# Patient Record
Sex: Female | Born: 1935 | Hispanic: No
Health system: Southern US, Community
[De-identification: ages and names within clinical notes are randomized; demographics above are authoritative.]

---

## 1998-05-09 ENCOUNTER — Other Ambulatory Visit: Admission: RE | Admit: 1998-05-09 | Discharge: 1998-05-09 | Payer: Self-pay | Admitting: Gynecology

## 1998-10-23 ENCOUNTER — Other Ambulatory Visit: Admission: RE | Admit: 1998-10-23 | Discharge: 1998-10-23 | Payer: Self-pay | Admitting: Gynecology

## 1999-11-24 ENCOUNTER — Other Ambulatory Visit: Admission: RE | Admit: 1999-11-24 | Discharge: 1999-11-24 | Payer: Self-pay | Admitting: Gynecology

## 2002-02-02 ENCOUNTER — Other Ambulatory Visit: Admission: RE | Admit: 2002-02-02 | Discharge: 2002-02-02 | Payer: Self-pay | Admitting: Gynecology

## 2015-11-13 ENCOUNTER — Other Ambulatory Visit: Payer: Self-pay | Admitting: Obstetrics & Gynecology

## 2015-11-13 DIAGNOSIS — R928 Other abnormal and inconclusive findings on diagnostic imaging of breast: Secondary | ICD-10-CM

## 2015-11-28 ENCOUNTER — Ambulatory Visit
Admission: RE | Admit: 2015-11-28 | Discharge: 2015-11-28 | Disposition: A | Discharge status: Home / Self Care | Payer: Medicare Other | Source: Ambulatory Visit | Attending: Obstetrics & Gynecology | Admitting: Obstetrics & Gynecology

## 2015-11-28 DIAGNOSIS — R928 Other abnormal and inconclusive findings on diagnostic imaging of breast: Secondary | ICD-10-CM

## 2016-04-28 ENCOUNTER — Other Ambulatory Visit: Payer: Self-pay | Admitting: Family Medicine

## 2016-04-28 DIAGNOSIS — R921 Mammographic calcification found on diagnostic imaging of breast: Secondary | ICD-10-CM

## 2016-05-19 DIAGNOSIS — Z7901 Long term (current) use of anticoagulants: Secondary | ICD-10-CM | POA: Insufficient documentation

## 2016-05-19 DIAGNOSIS — I4891 Unspecified atrial fibrillation: Secondary | ICD-10-CM | POA: Insufficient documentation

## 2016-05-26 ENCOUNTER — Ambulatory Visit
Admission: RE | Admit: 2016-05-26 | Discharge: 2016-05-26 | Disposition: A | Discharge status: Home / Self Care | Payer: Medicare Other | Source: Ambulatory Visit | Attending: Family Medicine | Admitting: Family Medicine

## 2016-05-26 DIAGNOSIS — R921 Mammographic calcification found on diagnostic imaging of breast: Secondary | ICD-10-CM

## 2016-10-28 ENCOUNTER — Other Ambulatory Visit: Payer: Self-pay | Admitting: Obstetrics & Gynecology

## 2016-10-28 DIAGNOSIS — R921 Mammographic calcification found on diagnostic imaging of breast: Secondary | ICD-10-CM

## 2016-11-04 ENCOUNTER — Ambulatory Visit (INDEPENDENT_AMBULATORY_CARE_PROVIDER_SITE_OTHER): Payer: Medicare Other | Admitting: Sports Medicine

## 2016-11-04 ENCOUNTER — Encounter: Payer: Self-pay | Admitting: Sports Medicine

## 2016-11-04 DIAGNOSIS — E119 Type 2 diabetes mellitus without complications: Secondary | ICD-10-CM | POA: Diagnosis not present

## 2016-11-04 DIAGNOSIS — L853 Xerosis cutis: Secondary | ICD-10-CM

## 2016-11-04 DIAGNOSIS — B351 Tinea unguium: Secondary | ICD-10-CM | POA: Diagnosis not present

## 2016-11-04 DIAGNOSIS — L84 Corns and callosities: Secondary | ICD-10-CM

## 2016-11-04 NOTE — Patient Instructions (Signed)

## 2016-11-04 NOTE — Progress Notes (Signed)
Subjective: Carol Parker is a 82 y.o. female patient with history of diabetes who presents to office today complaining of dry skin to heal and cracking and long, painful nails  while ambulating in shoes; unable to trim. Patient states that the glucose reading this morning was not recorded. Patient denies any new changes in medication or new problems. Patient denies any new cramping, numbness, burning or tingling in the legs.  Patient Active Problem List   Diagnosis Date Noted  . AF (atrial fibrillation) (HCC) 05/19/2016  . Chronic anticoagulation 05/19/2016   No current outpatient prescriptions on file prior to visit.   No current facility-administered medications on file prior to visit.    Allergies  Allergen Reactions  . Penicillins Rash    No results found for this or any previous visit (from the past 2160 hour(s)).  Objective: General: Patient is awake, alert, and oriented x 3 and in no acute distress.  Integument: Skin is warm, dry and supple bilateral. Nails are tender, long, thickened and dystrophic with subungual debris, consistent with onychomycosis, 1-5 bilateral. Severe dryness to plantar heels with mild fissuring and cracking No signs of infection. No open lesions or preulcerative lesions present bilateral. Remaining integument unremarkable.  Vasculature:  Dorsalis Pedis pulse 1/4 bilateral. Posterior Tibial pulse  1/4 bilateral. Capillary fill time 5 sec 1-5 bilateral. Scant hair growth to the level of the digits.Temperature gradient within normal limits. Mild varicosities present bilateral. No edema present bilateral.   Neurology: The patient has diminished sensation measured with a 5.07/10g Semmes Weinstein Monofilament at all pedal sites bilateral . Vibratory sensation diminished bilateral with tuning fork. No Babinski sign present bilateral.   Musculoskeletal: Asymptomatic hammertoe pedal deformities noted bilateral. Muscular strength 5/5 in all lower extremity muscular  groups bilateral without pain on range of motion. No tenderness with calf compression bilateral.  Assessment and Plan: Problem List Items Addressed This Visit    None    Visit Diagnoses    Dermatophytosis of nail    -  Primary   Callus of foot       Dry skin       Diabetes mellitus without complication (HCC)       Relevant Medications   losartan (COZAAR) 50 MG tablet   metFORMIN (GLUCOPHAGE-XR) 500 MG 24 hr tablet      -Examined patient. -Discussed and educated patient on diabetic foot care, especially with  regards to the vascular, neurological and musculoskeletal systems.  -Stressed the importance of good glycemic control and the detriment of not  controlling glucose levels in relation to the foot. -Mechanically debrided all nails 1-5 bilateral using sterile nail nipper and filed with dremel without incident  -Recommend provided term and daily skin emollients for dry heels and to apply Neosporin cream to fissuring to prevent infection -Advised patient to refrain from excessive use of Epsom salt 15 be contributing to dryness cannot get her heels -Answered all patient questions -Patient to return  in 3 months for at risk foot care -Patient advised to call the office if any problems or questions arise in the meantime.  Asencion Islamitorya Ladislaus Repsher, DPM

## 2016-11-05 ENCOUNTER — Ambulatory Visit
Admission: RE | Admit: 2016-11-05 | Discharge: 2016-11-05 | Disposition: A | Discharge status: Home / Self Care | Payer: Medicare Other | Source: Ambulatory Visit | Attending: Obstetrics & Gynecology | Admitting: Obstetrics & Gynecology

## 2016-11-05 DIAGNOSIS — R921 Mammographic calcification found on diagnostic imaging of breast: Secondary | ICD-10-CM

## 2016-11-30 ENCOUNTER — Telehealth: Payer: Self-pay | Admitting: *Deleted

## 2016-11-30 NOTE — Telephone Encounter (Addendum)
Pt called and states she has cracks in her heels and bottom of her feet. I spoke with pt and she is also having bleeding and drainage from the areas. I instructed pt to put antibiotic ointment to the areas, cover with white cotton socks and shoes until she was seen by Dr. Marylene LandStover.  Transferred to schedulers.

## 2016-11-30 NOTE — Telephone Encounter (Signed)
Ok thank you 

## 2016-12-02 ENCOUNTER — Ambulatory Visit (INDEPENDENT_AMBULATORY_CARE_PROVIDER_SITE_OTHER): Payer: Medicare Other | Admitting: Sports Medicine

## 2016-12-02 ENCOUNTER — Encounter: Payer: Self-pay | Admitting: Sports Medicine

## 2016-12-02 DIAGNOSIS — E119 Type 2 diabetes mellitus without complications: Secondary | ICD-10-CM

## 2016-12-02 DIAGNOSIS — L853 Xerosis cutis: Secondary | ICD-10-CM

## 2016-12-02 DIAGNOSIS — M79672 Pain in left foot: Secondary | ICD-10-CM

## 2016-12-02 DIAGNOSIS — M79671 Pain in right foot: Secondary | ICD-10-CM

## 2016-12-02 DIAGNOSIS — L84 Corns and callosities: Secondary | ICD-10-CM

## 2016-12-02 MED ORDER — LIDOCAINE 5 % EX OINT: 1.0000 "application " | TOPICAL_OINTMENT | Freq: Four times a day (QID) | CUTANEOUS | 4 refills | Status: DC | PRN

## 2016-12-02 NOTE — Progress Notes (Signed)
Subjective: Carol Parker is a 82 y.o. female patient with history of diabetes who returns to office today complaining of dry skin to heels and cracking states started to bleed and used antibiotic cream. Patient states that the glucose reading this morning was not recorded. Patient denies any other pedal complaints.  Patient Active Problem List   Diagnosis Date Noted  . AF (atrial fibrillation) (HCC) 05/19/2016  . Chronic anticoagulation 05/19/2016   Current Outpatient Prescriptions on File Prior to Visit  Medication Sig Dispense Refill  . albuterol (PROVENTIL HFA;VENTOLIN HFA) 108 (90 Base) MCG/ACT inhaler Inhale into the lungs.    Marland Kitchen. losartan (COZAAR) 50 MG tablet TK 1 T PO QD    . metFORMIN (GLUCOPHAGE-XR) 500 MG 24 hr tablet TK 1 T PO  QD WITH EVENING MEAL    . metoprolol succinate (TOPROL-XL) 50 MG 24 hr tablet TK 1 T PO QD    . Rivaroxaban (XARELTO) 15 MG TABS tablet Take 15 mg by mouth.     No current facility-administered medications on file prior to visit.    Allergies  Allergen Reactions  . Penicillins Rash    No results found for this or any previous visit (from the past 2160 hour(s)).  Objective: General: Patient is awake, alert, and oriented x 3 and in no acute distress.  Integument: Skin is warm, dry and supple bilateral. Nails are short consistent with onychomycosis, 1-5 bilateral. Severe dryness to plantar heels with mild fissuring and cracking; No signs of infection. No open lesions or preulcerative lesions present bilateral. Remaining integument unremarkable.  Vasculature:  Dorsalis Pedis pulse 1/4 bilateral. Posterior Tibial pulse  1/4 bilateral. Capillary fill time 5 sec 1-5 bilateral. Scant hair growth to the level of the digits.Temperature gradient within normal limits. Mild varicosities present bilateral. No edema present bilateral.   Neurology: The patient has diminished sensation measured with a 5.07/10g Semmes Weinstein Monofilament at all pedal sites  bilateral . Vibratory sensation diminished bilateral with tuning fork. No Babinski sign present bilateral.   Musculoskeletal: Asymptomatic hammertoe pedal deformities noted bilateral. Muscular strength 5/5 in all lower extremity muscular groups bilateral without pain on range of motion. No tenderness with calf compression bilateral.  Assessment and Plan: Problem List Items Addressed This Visit    None    Visit Diagnoses    Callus of foot    -  Primary   Dry skin       Diabetes mellitus without complication (HCC)       Foot pain, bilateral       Relevant Medications   lidocaine (XYLOCAINE) 5 % ointment     -Examined patient. -Discussed and educated patient on diabetic foot care, especially with  regards to the vascular, neurological and musculoskeletal systems.  -Stressed the importance of good glycemic control and the detriment of not  controlling glucose levels in relation to the foot. -Rx lidocaine ointment to use as needed for foot pain -Recommend continue with Revitaderm under occlusion at night and to apply Neosporin cream to fissuring to prevent infection during the day  -Advised patient to refrain from excessive use of Epsom salt at most once weekly -Answered all patient questions -Patient to return as scheduled in 2 months for routine diabetic foot care. -Patient advised to call the office if any problems or questions arise in the meantime.  Asencion Islamitorya Darren Caldron, DPM

## 2017-02-04 ENCOUNTER — Ambulatory Visit (INDEPENDENT_AMBULATORY_CARE_PROVIDER_SITE_OTHER): Payer: Medicare Other | Admitting: Sports Medicine

## 2017-02-04 ENCOUNTER — Telehealth: Payer: Self-pay | Admitting: *Deleted

## 2017-02-04 ENCOUNTER — Encounter: Payer: Self-pay | Admitting: Sports Medicine

## 2017-02-04 DIAGNOSIS — L84 Corns and callosities: Secondary | ICD-10-CM

## 2017-02-04 DIAGNOSIS — B351 Tinea unguium: Secondary | ICD-10-CM

## 2017-02-04 DIAGNOSIS — B359 Dermatophytosis, unspecified: Secondary | ICD-10-CM

## 2017-02-04 DIAGNOSIS — L853 Xerosis cutis: Secondary | ICD-10-CM | POA: Diagnosis not present

## 2017-02-04 DIAGNOSIS — E119 Type 2 diabetes mellitus without complications: Secondary | ICD-10-CM | POA: Diagnosis not present

## 2017-02-04 MED ORDER — TERBINAFINE HCL 1 % EX CREA: 1.0000 "application " | TOPICAL_CREAM | Freq: Two times a day (BID) | CUTANEOUS | 5 refills | Status: DC

## 2017-02-04 NOTE — Telephone Encounter (Addendum)
-----   Message from Asencion Islamitorya Stover, [place] sent at 02/04/2017 11:34 AM EST ----- Regarding: [facility] Tinea and thick callus skin plantar surfaces to feet not improved with urea cream and antifungals. Please see for other alternatives or possible biopsy   Patient sees Dr. Mayford KnifeWilliams for [facility] face at [facility]  Thanks Dr. Marylene LandStover. Required referral form, clinicals and demographics faxed to [facility].

## 2017-02-04 NOTE — Progress Notes (Signed)
Subjective: Carol Regino SchultzeM Bachtel is a 83 y.o. female patient with history of diabetes who returns to office today complaining of long nails and continued dry skin to heels and cracking, has been using revitaderm and using softening creams with no improvement. Patient states that the glucose reading this morning was not recorded. Patient denies any other pedal complaints.  Patient Active Problem List   Diagnosis Date Noted  . AF (atrial fibrillation) (HCC) 05/19/2016  . Chronic anticoagulation 05/19/2016   Current Outpatient Prescriptions on File Prior to Visit  Medication Sig Dispense Refill  . albuterol (PROVENTIL HFA;VENTOLIN HFA) 108 (90 Base) MCG/ACT inhaler Inhale into the lungs.    . lidocaine (XYLOCAINE) 5 % ointment Apply 1 application topically 4 (four) times daily as needed. 35 g 4  . losartan (COZAAR) 50 MG tablet TK 1 T PO QD    . metFORMIN (GLUCOPHAGE-XR) 500 MG 24 hr tablet TK 1 T PO  QD WITH EVENING MEAL    . metoprolol succinate (TOPROL-XL) 50 MG 24 hr tablet TK 1 T PO QD    . Rivaroxaban (XARELTO) 15 MG TABS tablet Take 15 mg by mouth.     No current facility-administered medications on file prior to visit.    Allergies  Allergen Reactions  . Penicillins Rash    No results found for this or any previous visit (from the past 2160 hour(s)).  Objective: General: Patient is awake, alert, and oriented x 3 and in no acute distress.  Integument: Skin is warm, dry and supple bilateral. Nails are elongated consistent with onychomycosis, 1-5 bilateral. Severe dryness to plantar heels with mild fissuring and cracking, possible superimposed tinea; No signs of infection. No open lesions or preulcerative lesions present bilateral. Remaining integument unremarkable.  Vasculature:  Dorsalis Pedis pulse 1/4 bilateral. Posterior Tibial pulse  1/4 bilateral. Capillary fill time 5 sec 1-5 bilateral. Scant hair growth to the level of the digits.Temperature gradient within normal limits. Mild  varicosities present bilateral. No edema present bilateral.   Neurology: The patient has diminished sensation measured with a 5.07/10g Semmes Weinstein Monofilament at all pedal sites bilateral . Vibratory sensation diminished bilateral with tuning fork. No Babinski sign present bilateral.   Musculoskeletal: Asymptomatic hammertoe pedal deformities noted bilateral. Muscular strength 5/5 in all lower extremity muscular groups bilateral without pain on range of motion. No tenderness with calf compression bilateral.  Assessment and Plan: Problem List Items Addressed This Visit    None    Visit Diagnoses    Dermatophytosis of nail    -  Primary   Relevant Medications   terbinafine (LAMISIL) 1 % cream   Callus of foot       Relevant Medications   terbinafine (LAMISIL) 1 % cream   Dry skin       Relevant Medications   terbinafine (LAMISIL) 1 % cream   Diabetes mellitus without complication (HCC)       Tinea       Relevant Medications   terbinafine (LAMISIL) 1 % cream     -Examined patient. -Discussed and educated patient on diabetic foot care, especially with  regards to the vascular, neurological and musculoskeletal systems.  -Stressed the importance of good glycemic control and the detriment of not  controlling glucose levels in relation to the foot. -Debrided nails x 10 using sterile nail nipper without incident -Rx Lamisil cream to use with other skin emollients -Consult to [facility] at [facility]  -Advised patient to refrain from excessive use of Epsom salt  at most once weekly -Answered all patient questions -Patient to return as scheduled in 2-3 months for routine diabetic foot care. -Patient advised to call the office if any problems or questions arise in the meantime.  Asencion Islam, DPM

## 2017-05-05 ENCOUNTER — Ambulatory Visit: Payer: Medicare Other | Admitting: Sports Medicine

## 2017-05-12 ENCOUNTER — Ambulatory Visit (INDEPENDENT_AMBULATORY_CARE_PROVIDER_SITE_OTHER): Payer: Medicare Other | Admitting: Sports Medicine

## 2017-05-12 ENCOUNTER — Encounter: Payer: Self-pay | Admitting: Sports Medicine

## 2017-05-12 DIAGNOSIS — M79672 Pain in left foot: Secondary | ICD-10-CM

## 2017-05-12 DIAGNOSIS — B351 Tinea unguium: Secondary | ICD-10-CM | POA: Diagnosis not present

## 2017-05-12 DIAGNOSIS — M79671 Pain in right foot: Secondary | ICD-10-CM | POA: Diagnosis not present

## 2017-05-12 DIAGNOSIS — L84 Corns and callosities: Secondary | ICD-10-CM

## 2017-05-12 DIAGNOSIS — E119 Type 2 diabetes mellitus without complications: Secondary | ICD-10-CM

## 2017-05-12 NOTE — Progress Notes (Signed)
Subjective: Carol Parker is a 83 y.o. female patient with history of diabetes who returns to office today complaining of long nails and continued dry skin to heels and cracking, has been to dermatologist with improvement. Patient states that the glucose reading this morning was not recorded. Patient denies any other pedal complaints.  Patient Active Problem List   Diagnosis Date Noted  . AF (atrial fibrillation) (HCC) 05/19/2016  . Chronic anticoagulation 05/19/2016   Current Outpatient Prescriptions on File Prior to Visit  Medication Sig Dispense Refill  . albuterol (PROVENTIL HFA;VENTOLIN HFA) 108 (90 Base) MCG/ACT inhaler Inhale into the lungs.    . lidocaine (XYLOCAINE) 5 % ointment Apply 1 application topically 4 (four) times daily as needed. 35 g 4  . losartan (COZAAR) 50 MG tablet TK 1 T PO QD    . metFORMIN (GLUCOPHAGE-XR) 500 MG 24 hr tablet TK 1 T PO  QD WITH EVENING MEAL    . metoprolol succinate (TOPROL-XL) 50 MG 24 hr tablet TK 1 T PO QD    . Rivaroxaban (XARELTO) 15 MG TABS tablet Take 15 mg by mouth.    . terbinafine (LAMISIL) 1 % cream Apply 1 application topically 2 (two) times daily. 30 g 5   No current facility-administered medications on file prior to visit.    Allergies  Allergen Reactions  . Penicillins Rash    No results found for this or any previous visit (from the past 2160 hour(s)).  Objective: General: Patient is awake, alert, and oriented x 3 and in no acute distress.  Integument: Skin is warm, dry and supple bilateral. Nails are elongated consistent with onychomycosis, 1-5 bilateral. Severe dryness to plantar heels with mild fissuring and cracking, possible superimposed tinea, improving; No signs of infection. No open lesions or preulcerative lesions present bilateral. Remaining integument unremarkable.  Vasculature:  Dorsalis Pedis pulse 1/4 bilateral. Posterior Tibial pulse  1/4 bilateral. Capillary fill time 5 sec 1-5 bilateral. Scant hair growth to  the level of the digits.Temperature gradient within normal limits. Mild varicosities present bilateral. No edema present bilateral.   Neurology: The patient has diminished sensation measured with a 5.07/10g Semmes Weinstein Monofilament at all pedal sites bilateral . Vibratory sensation diminished bilateral with tuning fork. No Babinski sign present bilateral.   Musculoskeletal: Asymptomatic hammertoe pedal deformities noted bilateral. Muscular strength 5/5 in all lower extremity muscular groups bilateral without pain on range of motion. No tenderness with calf compression bilateral.  Assessment and Plan: Problem List Items Addressed This Visit    None    Visit Diagnoses    Callus of foot    -  Primary   Diffuse plantar surfaces    Dermatophytosis of nail       Diabetes mellitus without complication (HCC)       Foot pain, bilateral         -Examined patient. -Discussed and educated patient on diabetic foot care, especially with  regards to the vascular, neurological and musculoskeletal systems.  -Stressed the importance of good glycemic control and the detriment of not  controlling glucose levels in relation to the foot. -Debrided nails x 10 using sterile nail nipper without incident -Continue with dermatology recs for callus skin  -Answered all patient questions -Patient to return as scheduled in 3 months for routine diabetic foot care. -Patient advised to call the office if any problems or questions arise in the meantime.  Asencion Islamitorya Haydin Calandra, DPM

## 2017-05-22 DIAGNOSIS — E871 Hypo-osmolality and hyponatremia: Secondary | ICD-10-CM

## 2017-05-22 DIAGNOSIS — I10 Essential (primary) hypertension: Secondary | ICD-10-CM

## 2017-05-22 DIAGNOSIS — E860 Dehydration: Secondary | ICD-10-CM

## 2017-05-22 DIAGNOSIS — I4891 Unspecified atrial fibrillation: Secondary | ICD-10-CM | POA: Diagnosis not present

## 2017-05-22 DIAGNOSIS — R531 Weakness: Secondary | ICD-10-CM | POA: Diagnosis not present

## 2017-05-23 DIAGNOSIS — E860 Dehydration: Secondary | ICD-10-CM | POA: Diagnosis not present

## 2017-05-23 DIAGNOSIS — E871 Hypo-osmolality and hyponatremia: Secondary | ICD-10-CM | POA: Diagnosis not present

## 2017-05-23 DIAGNOSIS — I10 Essential (primary) hypertension: Secondary | ICD-10-CM | POA: Diagnosis not present

## 2017-05-23 DIAGNOSIS — R531 Weakness: Secondary | ICD-10-CM | POA: Diagnosis not present

## 2017-05-23 DIAGNOSIS — I4891 Unspecified atrial fibrillation: Secondary | ICD-10-CM | POA: Diagnosis not present

## 2017-08-11 ENCOUNTER — Ambulatory Visit (INDEPENDENT_AMBULATORY_CARE_PROVIDER_SITE_OTHER): Payer: Medicare Other | Admitting: Sports Medicine

## 2017-08-11 DIAGNOSIS — E119 Type 2 diabetes mellitus without complications: Secondary | ICD-10-CM

## 2017-08-11 DIAGNOSIS — M79676 Pain in unspecified toe(s): Secondary | ICD-10-CM | POA: Diagnosis not present

## 2017-08-11 DIAGNOSIS — B351 Tinea unguium: Secondary | ICD-10-CM

## 2017-08-11 DIAGNOSIS — M79671 Pain in right foot: Secondary | ICD-10-CM

## 2017-08-11 DIAGNOSIS — M79672 Pain in left foot: Secondary | ICD-10-CM

## 2017-08-11 NOTE — Progress Notes (Signed)
Subjective: Carol Parker is a 83 y.o. female patient with history of diabetes who returns to office today complaining of long nails. States that her heels are all better after finishing cream and oral antifungal medication as given by Dermatologuist. Patient states that the glucose reading this morning was not recorded. Does not know A1c. Patient denies any other pedal complaints.  Patient Active Problem List   Diagnosis Date Noted  . AF (atrial fibrillation) (HCC) 05/19/2016  . Chronic anticoagulation 05/19/2016   Current Outpatient Prescriptions on File Prior to Visit  Medication Sig Dispense Refill  . albuterol (PROVENTIL HFA;VENTOLIN HFA) 108 (90 Base) MCG/ACT inhaler Inhale into the lungs.    . lidocaine (XYLOCAINE) 5 % ointment Apply 1 application topically 4 (four) times daily as needed. 35 g 4  . losartan (COZAAR) 50 MG tablet TK 1 T PO QD    . metFORMIN (GLUCOPHAGE-XR) 500 MG 24 hr tablet TK 1 T PO  QD WITH EVENING MEAL    . metoprolol succinate (TOPROL-XL) 50 MG 24 hr tablet TK 1 T PO QD    . Rivaroxaban (XARELTO) 15 MG TABS tablet Take 15 mg by mouth.    . terbinafine (LAMISIL) 1 % cream Apply 1 application topically 2 (two) times daily. 30 g 5   No current facility-administered medications on file prior to visit.    Allergies  Allergen Reactions  . Penicillins Rash    No results found for this or any previous visit (from the past 2160 hour(s)).  Objective: General: Patient is awake, alert, and oriented x 3 and in no acute distress.  Integument: Skin is warm, dry and supple bilateral. Nails are elongated consistent with onychomycosis, 1-5 bilateral, there is distal lifting of right 1st toenail with no signs of infection. Resolved dry skin and fissuring to heels bilateral; No signs of infection. No open lesions or preulcerative lesions present bilateral. Remaining integument unremarkable.  Vasculature:  Dorsalis Pedis pulse 1/4 bilateral. Posterior Tibial pulse  1/4  bilateral. Capillary fill time 5 sec 1-5 bilateral. Scant hair growth to the level of the digits.Temperature gradient within normal limits. Mild varicosities present bilateral. No edema present bilateral.   Neurology: The patient has diminished sensation measured with a 5.07/10g Semmes Weinstein Monofilament at all pedal sites bilateral. Vibratory sensation diminished bilateral with tuning fork. No Babinski sign present bilateral.   Musculoskeletal: Asymptomatic hammertoe pedal deformities noted bilateral. Muscular strength 5/5 in all lower extremity muscular groups bilateral without pain on range of motion. No tenderness with calf compression bilateral.  Assessment and Plan: Problem List Items Addressed This Visit    None    Visit Diagnoses    Dermatophytosis of nail    -  Primary   Diabetes mellitus without complication (HCC)       Foot pain, bilateral         -Examined patient. -Discussed and educated patient on diabetic foot care, especially with  regards to the vascular, neurological and musculoskeletal systems.  -Stressed the importance of good glycemic control and the detriment of not  controlling glucose levels in relation to the foot. -Debrided nails x 10 using sterile nail nipper without incident -Continue with skin emollients to prevent recurrence of heel callus/fissures -Answered all patient questions -Patient to return as scheduled in 3 months for routine diabetic foot care. -Patient advised to call the office if any problems or questions arise in the meantime.  Asencion Islam, DPM

## 2017-08-22 DIAGNOSIS — I4891 Unspecified atrial fibrillation: Secondary | ICD-10-CM | POA: Diagnosis not present

## 2017-08-22 DIAGNOSIS — E119 Type 2 diabetes mellitus without complications: Secondary | ICD-10-CM | POA: Diagnosis not present

## 2017-08-22 DIAGNOSIS — I10 Essential (primary) hypertension: Secondary | ICD-10-CM

## 2017-08-22 DIAGNOSIS — Z7901 Long term (current) use of anticoagulants: Secondary | ICD-10-CM | POA: Diagnosis not present

## 2017-08-22 DIAGNOSIS — R936 Abnormal findings on diagnostic imaging of limbs: Secondary | ICD-10-CM | POA: Diagnosis not present

## 2017-08-22 DIAGNOSIS — L03116 Cellulitis of left lower limb: Secondary | ICD-10-CM | POA: Diagnosis not present

## 2017-08-23 DIAGNOSIS — Z7901 Long term (current) use of anticoagulants: Secondary | ICD-10-CM | POA: Diagnosis not present

## 2017-08-23 DIAGNOSIS — I10 Essential (primary) hypertension: Secondary | ICD-10-CM | POA: Diagnosis not present

## 2017-08-23 DIAGNOSIS — E119 Type 2 diabetes mellitus without complications: Secondary | ICD-10-CM | POA: Diagnosis not present

## 2017-08-23 DIAGNOSIS — L03116 Cellulitis of left lower limb: Secondary | ICD-10-CM | POA: Diagnosis not present

## 2017-08-23 DIAGNOSIS — I4891 Unspecified atrial fibrillation: Secondary | ICD-10-CM | POA: Diagnosis not present

## 2017-08-23 DIAGNOSIS — R936 Abnormal findings on diagnostic imaging of limbs: Secondary | ICD-10-CM | POA: Diagnosis not present

## 2017-08-24 DIAGNOSIS — I10 Essential (primary) hypertension: Secondary | ICD-10-CM | POA: Diagnosis not present

## 2017-08-24 DIAGNOSIS — E119 Type 2 diabetes mellitus without complications: Secondary | ICD-10-CM | POA: Diagnosis not present

## 2017-08-24 DIAGNOSIS — Z7901 Long term (current) use of anticoagulants: Secondary | ICD-10-CM | POA: Diagnosis not present

## 2017-08-24 DIAGNOSIS — L03116 Cellulitis of left lower limb: Secondary | ICD-10-CM | POA: Diagnosis not present

## 2017-08-24 DIAGNOSIS — I4891 Unspecified atrial fibrillation: Secondary | ICD-10-CM | POA: Diagnosis not present

## 2017-08-24 DIAGNOSIS — R936 Abnormal findings on diagnostic imaging of limbs: Secondary | ICD-10-CM | POA: Diagnosis not present

## 2017-08-26 DIAGNOSIS — I4891 Unspecified atrial fibrillation: Secondary | ICD-10-CM | POA: Diagnosis not present

## 2017-08-26 DIAGNOSIS — E119 Type 2 diabetes mellitus without complications: Secondary | ICD-10-CM | POA: Diagnosis not present

## 2017-08-26 DIAGNOSIS — L03116 Cellulitis of left lower limb: Secondary | ICD-10-CM | POA: Diagnosis not present

## 2017-08-26 DIAGNOSIS — Z7901 Long term (current) use of anticoagulants: Secondary | ICD-10-CM | POA: Diagnosis not present

## 2017-08-26 DIAGNOSIS — R936 Abnormal findings on diagnostic imaging of limbs: Secondary | ICD-10-CM | POA: Diagnosis not present

## 2017-08-26 DIAGNOSIS — I10 Essential (primary) hypertension: Secondary | ICD-10-CM | POA: Diagnosis not present

## 2017-10-22 DIAGNOSIS — I10 Essential (primary) hypertension: Secondary | ICD-10-CM | POA: Diagnosis not present

## 2017-10-22 DIAGNOSIS — E119 Type 2 diabetes mellitus without complications: Secondary | ICD-10-CM | POA: Diagnosis not present

## 2017-10-22 DIAGNOSIS — W0110XA Fall on same level from slipping, tripping and stumbling with subsequent striking against unspecified object, initial encounter: Secondary | ICD-10-CM

## 2017-10-22 DIAGNOSIS — S72012A Unspecified intracapsular fracture of left femur, initial encounter for closed fracture: Secondary | ICD-10-CM | POA: Diagnosis not present

## 2017-10-23 DIAGNOSIS — W0110XA Fall on same level from slipping, tripping and stumbling with subsequent striking against unspecified object, initial encounter: Secondary | ICD-10-CM | POA: Diagnosis not present

## 2017-10-23 DIAGNOSIS — I10 Essential (primary) hypertension: Secondary | ICD-10-CM | POA: Diagnosis not present

## 2017-10-23 DIAGNOSIS — S72012A Unspecified intracapsular fracture of left femur, initial encounter for closed fracture: Secondary | ICD-10-CM | POA: Diagnosis not present

## 2017-10-23 DIAGNOSIS — E119 Type 2 diabetes mellitus without complications: Secondary | ICD-10-CM | POA: Diagnosis not present

## 2017-10-24 ENCOUNTER — Inpatient Hospital Stay (HOSPITAL_COMMUNITY): Payer: Medicare Other

## 2017-10-24 ENCOUNTER — Inpatient Hospital Stay (HOSPITAL_COMMUNITY): Payer: Medicare Other | Admitting: Certified Registered"

## 2017-10-24 ENCOUNTER — Inpatient Hospital Stay (HOSPITAL_COMMUNITY)
Admission: AD | Admit: 2017-10-24 | Discharge: 2017-11-20 | DRG: 023 | Disposition: E | Payer: Medicare Other | Source: Other Acute Inpatient Hospital | Attending: Neurology | Admitting: Neurology

## 2017-10-24 ENCOUNTER — Other Ambulatory Visit: Payer: Self-pay

## 2017-10-24 ENCOUNTER — Encounter (HOSPITAL_COMMUNITY): Payer: Self-pay | Admitting: Certified Registered"

## 2017-10-24 ENCOUNTER — Encounter (HOSPITAL_COMMUNITY): Admission: AD | Disposition: E | Payer: Self-pay | Source: Other Acute Inpatient Hospital | Attending: Neurology

## 2017-10-24 DIAGNOSIS — I63419 Cerebral infarction due to embolism of unspecified middle cerebral artery: Secondary | ICD-10-CM

## 2017-10-24 DIAGNOSIS — R9389 Abnormal findings on diagnostic imaging of other specified body structures: Secondary | ICD-10-CM

## 2017-10-24 DIAGNOSIS — J9811 Atelectasis: Secondary | ICD-10-CM

## 2017-10-24 DIAGNOSIS — Z452 Encounter for adjustment and management of vascular access device: Secondary | ICD-10-CM

## 2017-10-24 DIAGNOSIS — I63511 Cerebral infarction due to unspecified occlusion or stenosis of right middle cerebral artery: Principal | ICD-10-CM | POA: Diagnosis present

## 2017-10-24 DIAGNOSIS — W0110XA Fall on same level from slipping, tripping and stumbling with subsequent striking against unspecified object, initial encounter: Secondary | ICD-10-CM | POA: Diagnosis not present

## 2017-10-24 DIAGNOSIS — Z515 Encounter for palliative care: Secondary | ICD-10-CM | POA: Diagnosis not present

## 2017-10-24 DIAGNOSIS — Z9282 Status post administration of tPA (rtPA) in a different facility within the last 24 hours prior to admission to current facility: Secondary | ICD-10-CM

## 2017-10-24 DIAGNOSIS — R531 Weakness: Secondary | ICD-10-CM | POA: Diagnosis present

## 2017-10-24 DIAGNOSIS — I619 Nontraumatic intracerebral hemorrhage, unspecified: Secondary | ICD-10-CM

## 2017-10-24 DIAGNOSIS — E876 Hypokalemia: Secondary | ICD-10-CM | POA: Diagnosis present

## 2017-10-24 DIAGNOSIS — I340 Nonrheumatic mitral (valve) insufficiency: Secondary | ICD-10-CM | POA: Diagnosis not present

## 2017-10-24 DIAGNOSIS — J690 Pneumonitis due to inhalation of food and vomit: Secondary | ICD-10-CM | POA: Diagnosis not present

## 2017-10-24 DIAGNOSIS — E119 Type 2 diabetes mellitus without complications: Secondary | ICD-10-CM | POA: Diagnosis not present

## 2017-10-24 DIAGNOSIS — I63311 Cerebral infarction due to thrombosis of right middle cerebral artery: Secondary | ICD-10-CM

## 2017-10-24 DIAGNOSIS — I639 Cerebral infarction, unspecified: Secondary | ICD-10-CM | POA: Diagnosis not present

## 2017-10-24 DIAGNOSIS — I611 Nontraumatic intracerebral hemorrhage in hemisphere, cortical: Secondary | ICD-10-CM | POA: Diagnosis not present

## 2017-10-24 DIAGNOSIS — R4189 Other symptoms and signs involving cognitive functions and awareness: Secondary | ICD-10-CM | POA: Diagnosis not present

## 2017-10-24 DIAGNOSIS — Z978 Presence of other specified devices: Secondary | ICD-10-CM

## 2017-10-24 DIAGNOSIS — I959 Hypotension, unspecified: Secondary | ICD-10-CM | POA: Diagnosis present

## 2017-10-24 DIAGNOSIS — D696 Thrombocytopenia, unspecified: Secondary | ICD-10-CM | POA: Diagnosis not present

## 2017-10-24 DIAGNOSIS — S0101XA Laceration without foreign body of scalp, initial encounter: Secondary | ICD-10-CM

## 2017-10-24 DIAGNOSIS — J9601 Acute respiratory failure with hypoxia: Secondary | ICD-10-CM | POA: Diagnosis not present

## 2017-10-24 DIAGNOSIS — E785 Hyperlipidemia, unspecified: Secondary | ICD-10-CM | POA: Diagnosis present

## 2017-10-24 DIAGNOSIS — Z66 Do not resuscitate: Secondary | ICD-10-CM | POA: Diagnosis not present

## 2017-10-24 DIAGNOSIS — R440 Auditory hallucinations: Secondary | ICD-10-CM | POA: Diagnosis not present

## 2017-10-24 DIAGNOSIS — D500 Iron deficiency anemia secondary to blood loss (chronic): Secondary | ICD-10-CM | POA: Diagnosis not present

## 2017-10-24 DIAGNOSIS — Z880 Allergy status to penicillin: Secondary | ICD-10-CM | POA: Diagnosis not present

## 2017-10-24 DIAGNOSIS — G919 Hydrocephalus, unspecified: Secondary | ICD-10-CM

## 2017-10-24 DIAGNOSIS — R29712 NIHSS score 12: Secondary | ICD-10-CM | POA: Diagnosis present

## 2017-10-24 DIAGNOSIS — R0902 Hypoxemia: Secondary | ICD-10-CM | POA: Diagnosis not present

## 2017-10-24 DIAGNOSIS — G936 Cerebral edema: Secondary | ICD-10-CM

## 2017-10-24 DIAGNOSIS — S72012A Unspecified intracapsular fracture of left femur, initial encounter for closed fracture: Secondary | ICD-10-CM | POA: Diagnosis present

## 2017-10-24 DIAGNOSIS — Z7901 Long term (current) use of anticoagulants: Secondary | ICD-10-CM

## 2017-10-24 DIAGNOSIS — I609 Nontraumatic subarachnoid hemorrhage, unspecified: Secondary | ICD-10-CM | POA: Diagnosis present

## 2017-10-24 DIAGNOSIS — J45909 Unspecified asthma, uncomplicated: Secondary | ICD-10-CM | POA: Diagnosis present

## 2017-10-24 DIAGNOSIS — I63411 Cerebral infarction due to embolism of right middle cerebral artery: Secondary | ICD-10-CM

## 2017-10-24 DIAGNOSIS — R29810 Facial weakness: Secondary | ICD-10-CM | POA: Diagnosis present

## 2017-10-24 DIAGNOSIS — T17990A Other foreign object in respiratory tract, part unspecified in causing asphyxiation, initial encounter: Secondary | ICD-10-CM | POA: Diagnosis not present

## 2017-10-24 DIAGNOSIS — Z8781 Personal history of (healed) traumatic fracture: Secondary | ICD-10-CM | POA: Diagnosis not present

## 2017-10-24 DIAGNOSIS — D62 Acute posthemorrhagic anemia: Secondary | ICD-10-CM

## 2017-10-24 DIAGNOSIS — E871 Hypo-osmolality and hyponatremia: Secondary | ICD-10-CM | POA: Diagnosis present

## 2017-10-24 DIAGNOSIS — S72052A Unspecified fracture of head of left femur, initial encounter for closed fracture: Secondary | ICD-10-CM | POA: Diagnosis not present

## 2017-10-24 DIAGNOSIS — I615 Nontraumatic intracerebral hemorrhage, intraventricular: Secondary | ICD-10-CM | POA: Diagnosis present

## 2017-10-24 DIAGNOSIS — W1830XA Fall on same level, unspecified, initial encounter: Secondary | ICD-10-CM | POA: Diagnosis present

## 2017-10-24 DIAGNOSIS — I482 Chronic atrial fibrillation: Secondary | ICD-10-CM | POA: Diagnosis present

## 2017-10-24 DIAGNOSIS — G8194 Hemiplegia, unspecified affecting left nondominant side: Secondary | ICD-10-CM | POA: Diagnosis present

## 2017-10-24 DIAGNOSIS — I10 Essential (primary) hypertension: Secondary | ICD-10-CM | POA: Diagnosis not present

## 2017-10-24 DIAGNOSIS — Z7984 Long term (current) use of oral hypoglycemic drugs: Secondary | ICD-10-CM | POA: Diagnosis not present

## 2017-10-24 HISTORY — PX: IR US GUIDE VASC ACCESS RIGHT: IMG2390

## 2017-10-24 HISTORY — PX: IR PERCUTANEOUS ART THROMBECTOMY/INFUSION INTRACRANIAL INC DIAG ANGIO: IMG6087

## 2017-10-24 HISTORY — PX: RADIOLOGY WITH ANESTHESIA: SHX6223

## 2017-10-24 LAB — CBC WITH DIFFERENTIAL/PLATELET
BASOS ABS: 0 10*3/uL (ref 0.0–0.1)
BASOS PCT: 0 %
Eosinophils Absolute: 0 10*3/uL (ref 0.0–0.7)
Eosinophils Relative: 0 %
HEMATOCRIT: 25.7 % — AB (ref 36.0–46.0)
Hemoglobin: 9 g/dL — ABNORMAL LOW (ref 12.0–15.0)
Lymphocytes Relative: 8 %
Lymphs Abs: 0.5 10*3/uL — ABNORMAL LOW (ref 0.7–4.0)
MCH: 30.6 pg (ref 26.0–34.0)
MCHC: 35 g/dL (ref 30.0–36.0)
MCV: 87.4 fL (ref 78.0–100.0)
MONO ABS: 0.7 10*3/uL (ref 0.1–1.0)
Monocytes Relative: 10 %
NEUTROS ABS: 5.5 10*3/uL (ref 1.7–7.7)
NEUTROS PCT: 82 %
Platelets: 78 10*3/uL — ABNORMAL LOW (ref 150–400)
RBC: 2.94 MIL/uL — AB (ref 3.87–5.11)
RDW: 13.7 % (ref 11.5–15.5)
WBC: 6.7 10*3/uL (ref 4.0–10.5)

## 2017-10-24 LAB — CBC
HEMATOCRIT: 23.9 % — AB (ref 36.0–46.0)
HEMOGLOBIN: 8.1 g/dL — AB (ref 12.0–15.0)
MCH: 30.1 pg (ref 26.0–34.0)
MCHC: 33.9 g/dL (ref 30.0–36.0)
MCV: 88.8 fL (ref 78.0–100.0)
Platelets: 90 10*3/uL — ABNORMAL LOW (ref 150–400)
RBC: 2.69 MIL/uL — AB (ref 3.87–5.11)
RDW: 13.6 % (ref 11.5–15.5)
WBC: 7.6 10*3/uL (ref 4.0–10.5)

## 2017-10-24 LAB — PROTIME-INR
INR: 1.38
PROTHROMBIN TIME: 16.9 s — AB (ref 11.4–15.2)

## 2017-10-24 LAB — FIBRINOGEN: FIBRINOGEN: 240 mg/dL (ref 210–475)

## 2017-10-24 LAB — BASIC METABOLIC PANEL
ANION GAP: 7 (ref 5–15)
BUN: 13 mg/dL (ref 6–20)
CALCIUM: 7.6 mg/dL — AB (ref 8.9–10.3)
CO2: 19 mmol/L — AB (ref 22–32)
Chloride: 106 mmol/L (ref 101–111)
Creatinine, Ser: 0.71 mg/dL (ref 0.44–1.00)
Glucose, Bld: 144 mg/dL — ABNORMAL HIGH (ref 65–99)
Potassium: 3.6 mmol/L (ref 3.5–5.1)
Sodium: 132 mmol/L — ABNORMAL LOW (ref 135–145)

## 2017-10-24 LAB — PHOSPHORUS: PHOSPHORUS: 3.2 mg/dL (ref 2.5–4.6)

## 2017-10-24 LAB — MAGNESIUM: Magnesium: 1.3 mg/dL — ABNORMAL LOW (ref 1.7–2.4)

## 2017-10-24 LAB — GLUCOSE, CAPILLARY
Glucose-Capillary: 117 mg/dL — ABNORMAL HIGH (ref 65–99)
Glucose-Capillary: 118 mg/dL — ABNORMAL HIGH (ref 65–99)

## 2017-10-24 LAB — ABO/RH: ABO/RH(D): B POS

## 2017-10-24 LAB — PREPARE RBC (CROSSMATCH)

## 2017-10-24 SURGERY — RADIOLOGY WITH ANESTHESIA
Anesthesia: General

## 2017-10-24 MED ORDER — FENTANYL CITRATE (PF) 250 MCG/5ML IJ SOLN
INTRAMUSCULAR | Status: DC | PRN
  Administered 2017-10-24: 100 ug via INTRAVENOUS

## 2017-10-24 MED ORDER — ROCURONIUM BROMIDE 10 MG/ML (PF) SYRINGE
PREFILLED_SYRINGE | INTRAVENOUS | Status: DC | PRN
  Administered 2017-10-24: 50 mg via INTRAVENOUS
  Administered 2017-10-24: 30 mg via INTRAVENOUS

## 2017-10-24 MED ORDER — NITROGLYCERIN 1 MG/10 ML FOR IR/CATH LAB
INTRA_ARTERIAL | Status: AC
  Filled 2017-10-24: qty 10

## 2017-10-24 MED ORDER — SENNOSIDES-DOCUSATE SODIUM 8.6-50 MG PO TABS
1.0000 | ORAL_TABLET | Freq: Every evening | ORAL | Status: DC | PRN
Start: 2017-10-24 — End: 2017-10-27

## 2017-10-24 MED ORDER — ALBUMIN HUMAN 5 % IV SOLN
INTRAVENOUS | Status: DC | PRN
  Administered 2017-10-24 (×2): via INTRAVENOUS

## 2017-10-24 MED ORDER — LIDOCAINE 2% (20 MG/ML) 5 ML SYRINGE
INTRAMUSCULAR | Status: DC | PRN
  Administered 2017-10-24: 100 mg via INTRAVENOUS

## 2017-10-24 MED ORDER — SUGAMMADEX SODIUM 200 MG/2ML IV SOLN
INTRAVENOUS | Status: DC | PRN
  Administered 2017-10-24: 200 mg via INTRAVENOUS

## 2017-10-24 MED ORDER — ACETAMINOPHEN 160 MG/5ML PO SOLN: 650.0000 mg | ORAL | Status: DC | PRN

## 2017-10-24 MED ORDER — IOPAMIDOL (ISOVUE-300) INJECTION 61%
INTRAVENOUS | Status: AC
  Administered 2017-10-24: 74 mL
  Filled 2017-10-24: qty 150

## 2017-10-24 MED ORDER — PHENYLEPHRINE HCL 10 MG/ML IJ SOLN
INTRAMUSCULAR | Status: DC | PRN
  Administered 2017-10-24 (×2): 80 ug via INTRAVENOUS

## 2017-10-24 MED ORDER — ACETAMINOPHEN 325 MG PO TABS: 650.0000 mg | ORAL_TABLET | ORAL | Status: DC | PRN

## 2017-10-24 MED ORDER — INSULIN ASPART 100 UNIT/ML ~~LOC~~ SOLN
0.0000 [IU] | Freq: Three times a day (TID) | SUBCUTANEOUS | Status: DC
  Administered 2017-10-25 – 2017-10-26 (×4): 1 [IU] via SUBCUTANEOUS

## 2017-10-24 MED ORDER — POTASSIUM CHLORIDE 10 MEQ/100ML IV SOLN
10.0000 meq | INTRAVENOUS | Status: AC
  Administered 2017-10-24 – 2017-10-25 (×4): 10 meq via INTRAVENOUS
  Filled 2017-10-24 (×4): qty 100

## 2017-10-24 MED ORDER — PANTOPRAZOLE SODIUM 40 MG IV SOLR
40.0000 mg | Freq: Every day | INTRAVENOUS | Status: DC
  Administered 2017-10-24 – 2017-10-26 (×3): 40 mg via INTRAVENOUS
  Filled 2017-10-24 (×3): qty 40

## 2017-10-24 MED ORDER — SUCCINYLCHOLINE CHLORIDE 20 MG/ML IJ SOLN
INTRAMUSCULAR | Status: DC | PRN
  Administered 2017-10-24: 60 mg via INTRAVENOUS

## 2017-10-24 MED ORDER — ACETAMINOPHEN 650 MG RE SUPP
650.0000 mg | RECTAL | Status: DC | PRN
  Administered 2017-10-25: 650 mg via RECTAL
  Filled 2017-10-24 (×2): qty 1

## 2017-10-24 MED ORDER — STROKE: EARLY STAGES OF RECOVERY BOOK
Freq: Once | Status: DC
  Filled 2017-10-24: qty 1

## 2017-10-24 MED ORDER — VANCOMYCIN HCL IN DEXTROSE 1-5 GM/200ML-% IV SOLN
INTRAVENOUS | Status: AC
  Filled 2017-10-24: qty 200

## 2017-10-24 MED ORDER — EPTIFIBATIDE 20 MG/10ML IV SOLN
INTRAVENOUS | Status: AC
  Filled 2017-10-24: qty 10

## 2017-10-24 MED ORDER — PROPOFOL 10 MG/ML IV BOLUS
INTRAVENOUS | Status: DC | PRN
  Administered 2017-10-24: 70 mg via INTRAVENOUS

## 2017-10-24 MED ORDER — SODIUM CHLORIDE 0.9 % IV SOLN
Freq: Once | INTRAVENOUS | Status: AC
  Administered 2017-10-24: 18:00:00 via INTRAVENOUS

## 2017-10-24 MED ORDER — ONDANSETRON HCL 4 MG/2ML IJ SOLN
INTRAMUSCULAR | Status: DC | PRN
  Administered 2017-10-24: 4 mg via INTRAVENOUS

## 2017-10-24 MED ORDER — SODIUM CHLORIDE 0.9 % IV SOLN
INTRAVENOUS | Status: DC | PRN
  Administered 2017-10-24 (×2): via INTRAVENOUS

## 2017-10-24 MED ORDER — SODIUM CHLORIDE 0.9 % IV SOLN
INTRAVENOUS | Status: DC
  Administered 2017-10-24: 17:00:00 via INTRAVENOUS

## 2017-10-24 MED ORDER — CLEVIDIPINE BUTYRATE 0.5 MG/ML IV EMUL
0.0000 mg/h | INTRAVENOUS | Status: DC
  Administered 2017-10-25: 1 mg/h via INTRAVENOUS
  Administered 2017-10-26: 2 mg/h via INTRAVENOUS
  Filled 2017-10-24 (×2): qty 50

## 2017-10-24 MED ORDER — SODIUM CHLORIDE 0.9 % IV SOLN
INTRAVENOUS | Status: DC | PRN
  Administered 2017-10-24: 1000 mg via INTRAVENOUS

## 2017-10-24 MED ORDER — MAGNESIUM SULFATE 4 GM/100ML IV SOLN
4.0000 g | Freq: Once | INTRAVENOUS | Status: AC
  Administered 2017-10-24: 4 g via INTRAVENOUS
  Filled 2017-10-24: qty 100

## 2017-10-24 MED ORDER — PHENYLEPHRINE HCL 10 MG/ML IJ SOLN
INTRAMUSCULAR | Status: DC | PRN
  Administered 2017-10-24: 30 ug/min via INTRAVENOUS

## 2017-10-24 NOTE — Progress Notes (Signed)
PT Cancellation Note  Patient Details Name: Carol BrasilSusie M Saari MRN: [id number] DOB: 02-10-1929   Cancelled Treatment:    Reason Eval/Treat Not Completed: Patient not medically ready Orders received for evaluation, will need ortho recs Re:hip fx  and updated activity orders when appropriate.   Fabio AsaDevon J Elvis Boot 11/15/2017, 2:54 PM Charlotte Crumbevon Valeria Krisko, PT DPT  Board Certified Neurologic Specialist [phone]

## 2017-10-24 NOTE — Progress Notes (Signed)
Labs show hypokalemia (K:3.6) and hypomagnesemia (Mg 1.3). Have ordered KCL IV and 4g Magnesium sulfate IV. Recheck labs in AM.

## 2017-10-24 NOTE — H&P (Addendum)
HISTORY AND PHYSICAL No blood relatives or NOK reachable. 3 friends/fellow Church members at bedside. [name] [phone] (cell). [phone] (home). [phone] (work) -[name] [phone] (cell).  [name] [phone] (cell)    Chief Complaint: Acute onset Left sided weakness  History obtained from:   Carol Poll, MD -Attending at [facility] Patient friend Enid Derry [phone] Chart  Patients [facility] chart  HPI:                                                                                                                                         Carol Parker is an 83 y.o. female with a past medical history of hypertension asthma type 2 diabetes atrial fibrillation on Xarelto and history of recent recent urinary tract infection admitted to [facility] on 10/22/2017 for a left femoral head fracture.  Per records patient fell at home hit the left side of her head and her left hip on the way down.  She has been off her Xarelto since admission on 11/2 and was scheduled for surgical repair of her hip fracture today when she was found to be obtunded with left facial weakness and left-sided weakness at 856 this morning.  With a documented NIH of 12.  0935 CT head and neck which was positive for a right ICA terminus and right MCA M1 segment occlusion and an early right MCA territory ischemia with ASPECTS 9.  No significant arterial stenosis in the Head/Neck.  Results discussed between Dr. Lurlean Leyden and Dr. Jerrell Belfast at 9:44 AM.  Patient was accepted for transportation to [facility] for possible IR intervention.  At 10:04 AM TPA bolus was started at 1009 TPA infusion was started at 51.4 and at 10:37 AM TPA infusion was completed per report from the [facility] transport team.  TPA infusion times per report between Dr. Jerrell Belfast and the attending at [facility] Dr. Jossie Ng are that tPA infusion was completed at 10:53. Pt was transferred to [facility]  for possible IR intervention.   On arrival to [facility] patients NIH remained 12. She was immediately taken to CT. CT head shows possible subarachnoid hemorrhage versus contrast staining in the right frontal lobe and basal ganglia.  Imaging was reviewed by Dr. Shella Spearing and Dr. Jerrell Belfast decision was made to proceed with IR intervention.   Date last known well: Date: 10/31/2017 Time last known well: Time: 07:40 tPA Given: Yes  Modified Rankin: Rankin Score=0  History reviewed. No pertinent past medical history.  History reviewed. No pertinent surgical history.  History reviewed. No pertinent family history. Social History:  has an unknown smoking status. she has never used smokeless tobacco. Her alcohol and drug histories are not on file.  Allergies:  Allergies  Allergen Reactions  . Penicillins Rash   Medications:  Scheduled: . eptifibatide      . iopamidol      . iopamidol      . nitroGLYCERIN       Continuous: . vancomycin     PRN:  Current Facility-Administered Medications  Medication Dose Route Frequency Provider Last Rate Last Dose  . eptifibatide (INTEGRILIN) 20 MG/10ML injection           . iopamidol (ISOVUE-300) 61 % injection           . iopamidol (ISOVUE-300) 61 % injection           . nitroGLYCERIN 100 MCG/ML intra-arterial injection           . vancomycin (VANCOCIN) 1-5 GM/200ML-% IVPB            Facility-Administered Medications Ordered in Other Encounters  Medication Dose Route Frequency Provider Last Rate Last Dose  . 0.9 %  sodium chloride infusion    Continuous PRN Lucinda Dell, CRNA      . fentaNYL (SUBLIMAZE) injection    Anesthesia Intra-op Lucinda Dell, CRNA   100 mcg at 11/16/2017 1240  . lidocaine 2% (20 mg/mL) 5 mL syringe   Intravenous Anesthesia Intra-op Lucinda Dell, CRNA   100 mg at 11/01/2017 1240  . phenylephrine  (NEO-SYNEPHRINE) 0.04 mg/mL in dextrose 5 % 250 mL infusion    Continuous PRN Lucinda Dell, CRNA 22.5 mL/hr at 10/23/2017 1311 15 mcg/min at 11/15/2017 1311  . phenylephrine (NEO-SYNEPHRINE) injection    Anesthesia Intra-op Lucinda Dell, CRNA   80 mcg at 11/16/2017 1240  . propofol (DIPRIVAN) 10 mg/mL bolus/IV push    Anesthesia Intra-op Lucinda Dell, CRNA   70 mg at 11/14/2017 1240  . rocuronium bromide 10 mg/mL (PF) syringe   Intravenous Anesthesia Intra-op Lucinda Dell, CRNA   50 mg at 11/14/2017 1252  . succinylcholine (ANECTINE) injection    Anesthesia Intra-op Lucinda Dell, CRNA   60 mg at 11/17/2017 1240  . vancomycin (VANCOCIN) 1,000 mg in sodium chloride 0.9 % 250 mL IVPB   Intravenous Continuous PRN Lucinda Dell, CRNA   1,000 mg at 11/02/2017 1300    ROS:                                                                                                                                       History obtained from unobtainable from patient due to mental status  Neurologic Examination:  Blood pressure (!) 158/74, pulse 67, temperature 98.4 F (36.9 C), temperature source Axillary, resp. rate 15, SpO2 98 %.  HEENT-  + stitches left occipital. normocephalic, no lesions, without obvious abnormality.  Normal external eye and conjunctiva.  Normal TM's bilaterally.  Normal auditory canals and external ears. Normal external nose, mucus membranes and septum.  Normal pharynx. Cardiovascular- regular rate and rhythm, pulses palpable throughout   Lungs- chest clear, no wheezing, rales, normal symmetric air entry, Heart exam - S1, S2 normal, no murmur, no gallop, rate regular Abdomen- soft, non-tender; bowel sounds normal; no masses,  no organomegaly Extremities- no edema Lymph-no adenopathy palpable Musculoskeletal-+ joint tenderness at Left hip, deformity or swelling Skin-warm and  dry, no hyperpigmentation, vitiligo, or suspicious lesions  Neurological Examination Mental Status: Drowsy, briefly open eyes, follows some simple commands, speech garbled Cranial Nerves: II: Discs flat bilaterally; Visual fields appear normal,  III,IV, VI: ptosis not present, extra-ocular motions intact bilaterally, pupils equal, round, reactive to light and accommodation V,VII: Left facial droop, facial light touch sensation normal bilaterally VIII: hearing normal bilaterally IX,X: uvula rises symmetrically XI: bilateral shoulder shrug XII: midline tongue extension Motor: Right : Upper extremity   5/5    Left:     Upper extremity   1/5  Lower extremity   5/5     Lower extremity   1/5 Tone and bulk:normal tone throughout; no atrophy noted Sensory:  light touch intact throughout, bilaterally Cerebellar: unable to perform Gait: deferred  Lab Results: Basic Metabolic Panel: No results for input(s): NA, K, CL, CO2, GLUCOSE, BUN, CREATININE, CALCIUM, MG, PHOS in the last 168 hours.  [facility] Labs: HgA1C = 5.8 INR = 1.1  Liver Function Tests: No results for input(s): AST, ALT, ALKPHOS, BILITOT, PROT, ALBUMIN in the last 168 hours. No results for input(s): LIPASE, AMYLASE in the last 168 hours. No results for input(s): AMMONIA in the last 168 hours.  CBC: No results for input(s): WBC, NEUTROABS, HGB, HCT, MCV, PLT in the last 168 hours.  Cardiac Enzymes: No results for input(s): CKTOTAL, CKMB, CKMBINDEX, TROPONINI in the last 168 hours.  Lipid Panel: No results for input(s): CHOL, TRIG, HDL, CHOLHDL, VLDL, LDLCALC in the last 168 hours.  CBG: No results for input(s): GLUCAP in the last 168 hours.  Microbiology: No results found for this or any previous visit.  Coagulation Studies: No results for input(s): LABPROT, INR in the last 72 hours.  Imaging: Ct Head Wo Contrast  Result Date: 10/23/2017 CLINICAL DATA:  83 year old female transferred from [facility] for  emergent large vessel occlusion, right ICA terminus and right MCA. Now status post IV tPA, and transfer [facility]. EXAM: CT HEAD WITHOUT CONTRAST TECHNIQUE: Contiguous axial images were obtained from the base of the skull through the vertex without intravenous contrast. COMPARISON:  CTA head and neck and noncontrast head CT [facility] 0903 hours today. FINDINGS: Brain: Along the medial aspect of the right lentiform hypodensity seen earlier today there is now crescent-shaped and other scattered abnormal hyperdensity versus enhancement. There is more curvilinear abnormal hyperdensity or enhancement in the left inferior frontal gyrus (series 3, image 16). There is more confluent hyperdensity or enhancement in the mesial right temporal lobe (image 14) and also new curvilinear hyperdensity also at the right occipital pole (same image). There is also trace curvilinear hyperdensity along the more superior right frontal convexity (image 22). No associated mass effect. No intraventricular hemorrhage. No other extra-axial hemorrhage. No new areas of cytotoxic edema identified.  Left hemisphere and posterior fossa gray-white matter differentiation is stable. Vascular: Residual intravascular contrast is present. Calcified atherosclerosis at the skull base. Skull: No acute osseous abnormality identified. Sinuses/Orbits: Stable chronic sinusitis findings. Tympanic cavities and mastoids remain clear. Other: The patient has a broad-based left posterior convexity scalp hematoma measuring up to 7 mm in thickness which is new or increased from the CTA head earlier today (series 4, image 35). The underlying calvarium is intact. Other scalp soft tissues appear stable and within normal limits. No acute orbit soft tissue findings. IMPRESSION: 1. Small new multifocal areas of abnormal hyperdensity in the right hemisphere. Although some of these in the right MCA territory might reflect post ischemic contrast staining,  there is at least a small volume of acute hemorrhage, including that seen at the right occipital pole which is probably in the subarachnoid space. This was discussed in person with Dr. Milon Dikes on 10-26-17 at 1200 hours. 2. No intracranial mass effect. No new areas of cytotoxic edema in the right MCA territory. 3. New or increased left posterior scalp hematoma since 0902 hours today, 7 mm in thickness. No underlying skull fracture. Electronically Signed   By: Odessa Fleming M.D.   On: 26-Oct-2017 12:44   CHEST X RAY - [place] 10-26-17  0802 Small bilateral pleural effusions.  Suspicious for pneumonia.  Underlying emphysema.  CT head October 22, 2017  - [place] Atrophy with minimal small vessel ischemic disease.  No acute intracranial abnormality.  Chronic paranasal sinusitis.  Mild scalp contusion overlying the left occipital.  Remote appearing mild superior endplate compression of T1.  No acute cervical spine fracture.  CT of the pelvis October 22, 2017 - [place] Acute subcapital left femoral neck fracture with proximal migration and overriding of femoral shaft relative to femoral head.  No hip dislocation.  No other fracture.  ASSESSMENT AND PLAN Assessment: 83 y.o. female with a past medical history of hypertension asthma type 2 diabetes atrial fibrillation on Xarelto and history of recent recent urinary tract infection admitted to [facility] on 10/22/2017 for a left femoral head fracture. Transferred from [facility] with a  right ICA terminus and right MCA M1 segment occlusion and an early right MCA for possible IR intervention.   Acute right MCA stroke with M1 occlusion -IR intervention per Dr. Shella Spearing in progress -Admit patient to ICU -ECHO -Lipid panel and HgA1C -Routine post TPA stroke order set -Frequent neurological checks Supportive care  Atrial fibrillation, rate controlled with metoprolol.  Off Xarelto since October 31, 2017 -Rate controlled, Xarelto will remain  on hold  Chest x-ray suspicious for pneumonia -Repeat chest x-ray in a.m. -Nebs as needed -Respiratory therapy consultation  Acute subcapital left femoral neck fracture from mechanical fall on 10/23/2007 -Orthopedic surgery on hold until acute stroke space workup completed -Continue bedrest -Consult orthopedic surgery- Discussed case with DR Linna Caprice, He states that his partner Dr Duwayne Heck will consult on the patient in the morning and tentatively plan for Hip Repair on Friday if patient is medically stable. -Pain control  Hypertension -Cardene drip if necessary to maintain blood pressure   Type 2 diabetes, non-insulin-dependent -Continue to hold metformin -SSI coverage and Accu-Cheks  Recent history of urinary tract infection -Obtain UA CNS  History of asthma -Nebs as needed -Respiratory therapy consultation  Assessment and plan discussed with with attending physician and they are in agreement.    Beryl Meager, ANP-C [facility] Oct 26, 2017, 12:55 PM   Stroke Risk Factors - atrial fibrillation,  diabetes mellitus and hyperlipidemia    No blood relatives or NOK reachable. 3 friends/fellow Church members at bedside. [name] [phone] (cell). [phone] (home). [phone] (work) -[name] [phone] (cell).  [name] [phone] (cell)  Attending Neurohospitalist Addendum Patient seen and examined. Agree with the history and physical as documented above. Agree with the plan as documented, which I helped formulate. I have independently obtaining history, and review of systems on my exam and the patient and reviewed the chart including pertinent labs and pertinent imaging.  Initial history and physical as documented above. Patient was taken in for IR guided thrombectomy after she had received IV TPA at [facility]. Upon arrival, her NIH stroke scale was 12. CT scan on arrival, showed small new multifocal areas of abnormal  hyperdensity in the right hemisphere, some of them in the right MCA territory that probably could be consistent with contrast staining.  There is a small area of focus in the occipital pole on the right that was consistent with possible subarachnoid.  Increased scalp hematoma. She was taken in for emergent interventional radiology intra-arterial thrombectomy with successful TICI3 right MCA recanalization after 3 attempts. Follow-up CT scan after IR guided thrombectomy revealed increase in the size of the small frontal and occipital along with temporal areas of hyperdensity that was seen on repeat head CT after TPA. Her hemoglobin dropped to 6.5 also. She has no next of kin or blood relatives that are alive.  She had 3 friends from church listed above, who are her social support system.  She has no medical power of attorney listed. One of the friends Mr. [name], reported that when asked about resuscitation and CODE STATUS, prior to admission at [facility] for the hip fracture, she said she would want to be resuscitated.  None of them could tell if she would like to be resuscitated knowing that she has large amount of brain bleeds and possibly poor chance of good functional outcome.    For now she is still a full code  As soon as the CT scan was obtained and increased hematomas were seen, TPA reversal protocol was initiated.  Fibrinogen levels ordered. Blood pressure goal less than 160 Cryoprecipitate 0.15 units/kg ordered Can consider repeating cryoprecipitate once fibrinogen levels come back for goals original levels of more than 100. Can also consider epsilon aminocaproic acid (Amicar) 5 g over 20 minutes if cryoprecipitate infusion can be used. Can consider platelet transfusion for Plt <150. Repeat CTH at 11pm CBC at [facility] pending.  Repeat exam after IR Patient is drowsy, she was extubated after our procedure, opens eyes to noxious stimulus and sternal rub.  Does not follow any  commands. Pupils equal round reactive to light.  No gaze preference.  Right gaze preference. On noxious stimulation, localizes with the right upper extremity.  Does not localize with left upper extremity. No spontaneous movement of left upper extremity. Head is wrapped in a bandage with blood oozing.  We will continue to follow her.  We have requested pulmonary critical care medicine for consultation. We also requested orthopedics for consultation, but I think the fracture treatment of orthopedics will have to be postponed given the critical nature of her head bleed. A neurosurgical consultation can be considered but I do not think that she would be a good neurosurgical candidate.  Since the patient does not have a medical power of attorney, an ethics consult should be placed, in case her clinical condition worsens and her exam deteriorates without any  improvement over the next days.  --- Milon Dikes, MD [facility] [phone]  If 7pm to 7am, please call on call as listed on AMION.   CRITICAL CARE ATTESTATION This patient is critically ill and at significant risk of neurological worsening, death and care requires constant monitoring of vital signs, hemodynamics,respiratory and cardiac monitoring, neurological assessment, discussion with family, other specialists and medical decision making of high complexity. I spent 90 minutes of neurocritical care time  in the care of  this patient.

## 2017-10-24 NOTE — Anesthesia Procedure Notes (Signed)
Procedure Name: Intubation Date/Time: 11/01/2017 12:41 PM Performed by: Clearnce Sorrel, CRNA Pre-anesthesia Checklist: Patient identified, Emergency Drugs available, Suction available, Patient being monitored and Timeout performed Patient Re-evaluated:Patient Re-evaluated prior to induction Oxygen Delivery Method: Circle system utilized Preoxygenation: Pre-oxygenation with 100% oxygen Induction Type: IV induction Ventilation: Mask ventilation without difficulty Laryngoscope Size: Mac and 3 Grade View: Grade I Tube type: Subglottic suction tube Tube size: 7.0 mm Number of attempts: 1 Airway Equipment and Method: Stylet Placement Confirmation: ETT inserted through vocal cords under direct vision,  positive ETCO2 and breath sounds checked- equal and bilateral Secured at: 22 cm Tube secured with: Tape Dental Injury: Teeth and Oropharynx as per pre-operative assessment

## 2017-10-24 NOTE — Transfer of Care (Signed)
Immediate Anesthesia Transfer of Care Note  Patient: Carol Parker  Procedure(s) Performed: RADIOLOGY WITH ANESTHESIA (N/A )  Patient Location: NICU  Anesthesia Type:General  Level of Consciousness: awake  Airway & Oxygen Therapy: Patient Spontanous Breathing and Patient connected to face mask oxygen  Post-op Assessment: Report given to RN and Post -op Vital signs reviewed and stable  Post vital signs: Reviewed and stable  Last Vitals:  Vitals:   08/02/2017 1216 08/02/2017 1230  BP: (!) 175/85 (!) 158/74  Pulse: 77 67  Resp: (!) 22 15  Temp: 36.9 C   SpO2: 99% 98%    Last Pain:  Vitals:   08/02/2017 1216  TempSrc: Axillary         Complications: No apparent anesthesia complications

## 2017-10-24 NOTE — Anesthesia Preprocedure Evaluation (Addendum)
Anesthesia Evaluation  Patient identified by MRN, date of birth, ID band Patient confused    Reviewed: Allergy & Precautions, NPO status , Patient's Chart, lab work & pertinent test results, Unable to perform ROS - Chart review only  Airway Mallampati: II  TM Distance: >3 FB Neck ROM: Full    Dental  (+) Teeth Intact   Pulmonary COPD,    breath sounds clear to auscultation       Cardiovascular hypertension,  Rhythm:Regular     Neuro/Psych CVA    GI/Hepatic   Endo/Other  diabetes  Renal/GU      Musculoskeletal   Abdominal   Peds  Hematology   Anesthesia Other Findings   Reproductive/Obstetrics                            Anesthesia Physical Anesthesia Plan  ASA: IV  Anesthesia Plan: General   Post-op Pain Management:    Induction: Intravenous and Rapid sequence  PONV Risk Score and Plan: 3 and Ondansetron and Treatment may vary due to age or medical condition  Airway Management Planned: Oral ETT  Additional Equipment: Arterial line  Intra-op Plan:   Post-operative Plan: Possible Post-op intubation/ventilation  Informed Consent:   History available from chart only and Only emergency history available  Plan Discussed with: CRNA and Surgeon  Anesthesia Plan Comments:         Anesthesia Quick Evaluation

## 2017-10-24 NOTE — Progress Notes (Signed)
I was asked to see patient by my partner Dr Wallace CullensGray, out of concerns she may not be protecting her airway. On my exam, vitals were T:97.9, BP:131/67, HR 81, RR 14, Pox 100% on face mask. Patient somnolent but arousable to voice and is answering questions with 1-word answers such as "fine" or "okay". Patient is obeying some commands. Lungs CTA b/l. No stridor. Abd soft NTND. Small amount of erythematous rash on lower abdomen that appears confluent erythema with raised appearance. Question if tape allergy? Will keep close eye on the rash. At this time patient is protecting her airway and does NOT require intubation. Will keep NPO. No labs are in our EMR; will place labs and ABG now. RN to repeat Hgb after 2nd unit of pRBC (1st unit is transfusing now). [facility] to follow-up on patient again later tonight.   35 minutes critical care time   Milana ObeyKathleen Hammonds, MD Pulmonary & Critical Care Medicine Pager: [phone]

## 2017-10-24 NOTE — Progress Notes (Signed)
Vital signs taken upon arrival to floor. Patient taken to IR within 10 minutes of admission, unable to obtain post TPA vitals Q15 minutes per protocol. Charge nurse made aware. Patient able to answer questions with little difficulty, there is some slight slurring of speech a this time. She is able to move the right side, the left had and fingers have movement but no grip and she is able to wiggle her toes to the left.

## 2017-10-24 NOTE — Consult Note (Signed)
PULMONARY / CRITICAL CARE MEDICINE   Name: Carol Parker MRN: [id number] DOB: 1929/06/13    ADMISSION DATE:  2017/11/13 CONSULTATION DATE:  11/13/17  CHIEF COMPLAINT: New-onset left hemiparesis  HISTORY OF PRESENT ILLNESS:   Is an 83 year old who normally takes Xarelto for chronic atrial fibrillation who fell and fractured her hip on 11/2.  Relistor was held in anticipation of surgery and this morning she was found relatively unresponsive with a new onset left hemiparesis.  CTA was performed at the outside hospital showing a right MCA occlusion.  She was given systemic TPA and transferred to this hospital for thrombectomy.  Thrombectomy of the right MCA was performed and she has been transferred to the intensive care unit extubated where she is extremely lethargic and only responds with partial sentences in response to sternal rub.  Fortunately I have no significant source of past medical history.  PAST MEDICAL HISTORY :  She  has no past medical history on file.  PAST SURGICAL HISTORY: She  has a past surgical history that includes IR PERCUTANEOUS ART THROMBECTOMY/INFUSION INTRACRANIAL INC DIAG ANGIO (11/13/2017) and IR US Guide Vasc Access Right (11-13-2017).  Allergies  Allergen Reactions  . Penicillins Rash    No current facility-administered medications on file prior to encounter.    Current Outpatient Medications on File Prior to Encounter  Medication Sig  . albuterol (PROVENTIL HFA;VENTOLIN HFA) 108 (90 Base) MCG/ACT inhaler Inhale into the lungs.  . lidocaine (XYLOCAINE) 5 % ointment Apply 1 application topically 4 (four) times daily as needed.  Marland Kitchen losartan (COZAAR) 50 MG tablet TK 1 T PO QD  . metFORMIN (GLUCOPHAGE-XR) 500 MG 24 hr tablet TK 1 T PO  QD WITH EVENING MEAL  . metoprolol succinate (TOPROL-XL) 50 MG 24 hr tablet TK 1 T PO QD  . Rivaroxaban (XARELTO) 15 MG TABS tablet Take 15 mg by mouth.  . terbinafine (LAMISIL) 1 % cream Apply 1 application topically 2 (two) times  daily.    FAMILY HISTORY:  Her has no family status information on file.    SOCIAL HISTORY: She  has an unknown smoking status. she has never used smokeless tobacco.  REVIEW OF SYSTEMS:   Unobtainable  SUBJECTIVE:  Unobtainable  VITAL SIGNS: BP 128/64   Pulse 80   Temp (!) 97.4 F (36.3 C) (Axillary)   Resp 13   SpO2 100%   HEMODYNAMICS:    VENTILATOR SETTINGS:    INTAKE / OUTPUT: No intake/output data recorded.  PHYSICAL EXAMINATION: General: This is an elderly female with a Kerlix on her head with dried blood staining the dressing.  Lethargic. Neuro: Does not respond to voice.  In response to sternal rub purposeful with the right upper extremity and partially eye opens.  Does produce partial sentences in response to sternal rub.  Resists eye opening. Cardiovascular: There is no JVD, there is no dependent edema.  Pink and warm.  S1 and S2 are irregular irregular without murmur rub or gallop. Lungs: Intermittently sonorous.  Abrasions are unlabored, there is symmetric air movement, few scattered rhonchi, no wheezes. Abdomen: Flat and soft without any overt organomegaly masses tenderness guarding or rebound  LABS:  BMET No results for input(s): NA, K, CL, CO2, BUN, CREATININE, GLUCOSE in the last 168 hours.  Electrolytes No results for input(s): CALCIUM, MG, PHOS in the last 168 hours.  CBC No results for input(s): WBC, HGB, HCT, PLT in the last 168 hours.  Coag's No results for input(s): APTT, INR in the last  168 hours.  Sepsis Markers No results for input(s): LATICACIDVEN, PROCALCITON, O2SATVEN in the last 168 hours.  ABG No results for input(s): PHART, PCO2ART, PO2ART in the last 168 hours.  Liver Enzymes No results for input(s): AST, ALT, ALKPHOS, BILITOT, ALBUMIN in the last 168 hours.  Cardiac Enzymes No results for input(s): TROPONINI, PROBNP in the last 168 hours.  Glucose Recent Labs  Lab 11/01/2017 1643  GLUCAP 117*    Imaging Ct Head  Wo Contrast  Result Date: 11/07/2017 CLINICAL DATA:  Status post revascularization of the right middle cerebral artery. EXAM: CT HEAD WITHOUT CONTRAST TECHNIQUE: Contiguous axial images were obtained from the base of the skull through the vertex without intravenous contrast. COMPARISON:  CT head without contrast from the same day at 11:50 a.m. FINDINGS: Brain: There significant increase in size and number of multiple foci of parenchymal hemorrhage. The largest is in the right frontal lobe, measuring 3.6 x 3.8 x 6.0 cm. There is hemorrhage into the right basal ganglia and anterior limb internal capsule. Right occipital lobe hemorrhages noted. Hemorrhage in the medial right temporal lobe measures 3.4 x 2.5 x 4.8 cm. Diffuse subarachnoid hemorrhage is present involving the suprasellar cisterns and along the tentorium. Minimal blood is noted within the ventricles, posteriorly on the left. There is some dilation of the left temporal tip suggesting early hydrocephalus. The para mesencephalic cisterns are opacified. Right-to-left midline shift measures 6.5 mm. Vascular: Atherosclerotic calcifications are present within the cavernous internal carotid artery is bilaterally. There is no residual hyperdense vessel. Skull: Calvarium is intact. Sinuses/Orbits: Chronic sinus disease is again seen. Bilateral lens replacements are noted. IMPRESSION: 1. Multiple sites of parenchymal hemorrhage involving the right frontal lobe, right basal ganglia, and right temporal lobe. 2. Progressive hemorrhage in the right occipital lobe. 3. Extensive effacement of the sulci over the right hemispheric and 6 mm of right-to-left midline shift. 4. Diffuse subarachnoid hemorrhage is now present. 5. Intraventricular hemorrhages noted with early dilation of the left temporal tip. Critical Value/emergent results were called by telephone at the time of interpretation on 11/02/2017 at 4:36 pm to Dr. Gilmer Mor , who verbally acknowledged these  results. Electronically Signed   By: Marin Roberts M.D.   On: 10/23/2017 16:38   Ct Head Wo Contrast  Result Date: 10/22/2017 CLINICAL DATA:  83 year old female transferred from [facility] for emergent large vessel occlusion, right ICA terminus and right MCA. Now status post IV tPA, and transfer to [facility]. EXAM: CT HEAD WITHOUT CONTRAST TECHNIQUE: Contiguous axial images were obtained from the base of the skull through the vertex without intravenous contrast. COMPARISON:  CTA head and neck and noncontrast head CT [facility] 0903 hours today. FINDINGS: Brain: Along the medial aspect of the right lentiform hypodensity seen earlier today there is now crescent-shaped and other scattered abnormal hyperdensity versus enhancement. There is more curvilinear abnormal hyperdensity or enhancement in the left inferior frontal gyrus (series 3, image 16). There is more confluent hyperdensity or enhancement in the mesial right temporal lobe (image 14) and also new curvilinear hyperdensity also at the right occipital pole (same image). There is also trace curvilinear hyperdensity along the more superior right frontal convexity (image 22). No associated mass effect. No intraventricular hemorrhage. No other extra-axial hemorrhage. No new areas of cytotoxic edema identified. Left hemisphere and posterior fossa Azreal Stthomas-white matter differentiation is stable. Vascular: Residual intravascular contrast is present. Calcified atherosclerosis at the skull base. Skull: No acute osseous abnormality identified. Sinuses/Orbits: Stable chronic sinusitis findings.  Tympanic cavities and mastoids remain clear. Other: The patient has a broad-based left posterior convexity scalp hematoma measuring up to 7 mm in thickness which is new or increased from the CTA head earlier today (series 4, image 35). The underlying calvarium is intact. Other scalp soft tissues appear stable and within normal limits. No acute orbit  soft tissue findings. IMPRESSION: 1. Small new multifocal areas of abnormal hyperdensity in the right hemisphere. Although some of these in the right MCA territory might reflect post ischemic contrast staining, there is at least a small volume of acute hemorrhage, including that seen at the right occipital pole which is probably in the subarachnoid space. This was discussed in person with Dr. Milon Dikes on 11/05/2017 at 1200 hours. 2. No intracranial mass effect. No new areas of cytotoxic edema in the right MCA territory. 3. New or increased left posterior scalp hematoma since 0902 hours today, 7 mm in thickness. No underlying skull fracture. Electronically Signed   By: Odessa Fleming M.D.   On: 11/15/2017 12:44   Ir US Guide Vasc Access Right  Result Date: 11/17/2017 INDICATION: 83 year old female with a history of acute right MCA syndrome, normal baseline, stroke exam of 12, CT imaging showing large vessel occlusion. She has been transferred after a dose of IV tPA, formed attempt at mechanical thrombectomy. CT performed to evaluate for ASPECTS decay shows stable aspects score, however, hemorrhagic transformation in several small peripheral regions and basal ganglia. After a thorough discussion of risks and benefits with the Stroke Neurology team, it is felt that attempting mechanical thrombectomy will give the patient the best chance of recovery. EXAM: ULTRASOUND GUIDED ACCESS RIGHT COMMON FEMORAL ARTERY CEREBRAL ANGIOGRAM MECHANICAL THROMBECTOMY RIGHT M2 BRANCH SUTURE MEDIATED CLOSURE OF RIGHT COMMON FEMORAL ARTERY ACCESS COMPARISON:  CT imaging same day MEDICATIONS: Vancomycin 1 gm IV. The antibiotic was administered within 1 hour of the procedure ANESTHESIA/SEDATION: General anesthesia CONTRAST:  148 cc Isovue FLUOROSCOPY TIME:  Fluoroscopy Time: 56 minutes 48 seconds (1,910 mGy). COMPLICATIONS: None immediate. TECHNIQUE: Emergent consent was obtained/presumed from as there is no family available. Specific risks  include: Bleeding, infection, contrast reaction, kidney injury/failure, need for further procedure/surgery, arterial injury or dissection, embolization to new territory, intracranial hemorrhage (10-15% risk), neurologic deterioration, cardiopulmonary collapse, death. All questions were addressed. Maximal Sterile Barrier Technique was utilized including during the procedure including caps, mask, sterile gowns, sterile gloves, sterile drape, hand hygiene and skin antiseptic. A timeout was performed prior to the initiation of the procedure. FINDINGS: Initial: Significant tortuosity at the aortic arch with type 3 arch Right common carotid artery: Mild tortuosity of the common carotid artery without significant plaque. Right external carotid artery: Patent with antegrade flow. Right internal carotid artery: Normal course caliber and contour of the cervical portion. Vertical and petrous segment patent with normal course caliber contour. Cavernous segment patent. Clinoid segment patent. Antegrade flow of the ophthalmic artery. Ophthalmic segment patent. Terminus patent. Right MCA:  M1 segment patent.  Occlusion of superior division. Right ACA: A 1 segment patent. A 2 segment perfuses the right territory. Cross filling of the communicating artery with some flash filling of the left anterior cerebral territory. Final: Final angiogram demonstrates restoration of TICI3 flow of the MCA territory. PROCEDURE: Patient is brought to the interventional radiology suite with the patient's identity confirmed. Patient positioned supine position on the fluoroscopy table. General anesthesia was induced by the anesthesia team. Patient is then prepped and draped in the usual sterile fashion. Ultrasound survey of the right  inguinal region was performed with images stored and sent to PACs. 11 blade scalpel was used to make a small incision. A micropuncture needle was used access the right common femoral artery under ultrasound. With excellent  arterial blood flow returned, an .018 micro wire was passed through the needle, observed to enter the abdominal aorta under fluoroscopy. The needle was removed, and a micropuncture sheath was placed over the wire. The inner dilator and wire were removed, and an 035 Bentson wire was advanced under fluoroscopy into the abdominal aorta. The sheath was removed and a standard 5 [place] vascular sheath was placed. The dilator was removed and the sheath was flushed. A 54F JB-1 diagnostic catheter was advanced over the wire to the proximal descending thoracic aorta. Wire was then removed. Double flush of the catheter was performed. Catheter was then used to select the innominate artery. Angiogram was performed. Using roadmap technique, the catheter was advanced over a roadrunner wire, immediately flipping into the aortic arch given the tortuosity. Roadrunner wire was removed and a Glidewire was used. Using the Glidewire, catheter was advanced into the proximal segment of the internal carotid artery. Attempt a passing the Rosen wire was initially unsuccessful, as the diagnostic catheter withdrew into the proximal internal carotid artery. Roadrunner wire was then passed into the distal ICA, and the diagnostic catheter was then repositioned into the distal ICA. Rosen wire was then placed into the distal ICA. Formal angiogram was performed. The 5 French sheath was removed and exchanged for 8 French 55 centimeter BrightTip sheath. Sheath was flushed and attached to pressurized and heparinized saline bag for constant forward flow. Then an 8 [place], 85 cm Flowgate balloon tip catheter was prepared on the back table with inflation of the balloon with 50/50 concentration of dilute contrast. The balloon catheter was then advanced over the wire, an using the coaxial Bern shaped catheter, positioned into the mid internal carotid artery. Copious back flush was performed and the balloon catheter was attached to heparinized and pressurized  saline bag for forward flow. Angiogram was performed for road map guide. Microcatheter system was then introduced through the balloon guide catheter, using a synchro soft 014 wire and a Trevo Provue18 catheter. Microcatheter system was advanced into the internal carotid artery, to the level of the occlusion. The micro wire was then carefully advanced through the occluded M2 segment. Microcatheter would not pass over the wire through the occluded segment without significant tension on the system, which caused the balloon guide catheter to prolapse into the aortic arch. A was necessary to removed microcatheter and microwire, and reduce the system by removal of the balloon guide catheter. JB 1 diagnostic catheter and Glidewire were then used to select the innominate artery. JB 1 catheter was advanced into the internal carotid artery over the standard Glidewire. Again, the roadrunner wire was then placed into the distal internal carotid artery an the JB 1 catheter was advanced distally to the internal carotid artery. Rosen wire was then placed to the distal internal carotid artery. Eight [place] Merci balloon guide catheter was then advanced over the Rosen wire into the internal carotid artery, using coaxial straight catheter/introducer. Once the Merci balloon guide catheter was in place, roadmap angiogram was performed. Microcatheter and synchro soft wire were then used to navigate to the face of the occlusion. Microcatheter was then push through the occluded segment and the wire was removed. Blood was then aspirated through the hub of the microcatheter, and a gentle contrast injection was performed confirming  intraluminal position. A rotating hemostatic valve was then attached to the back end of the microcatheter, and a pressurized and heparinized saline bag was attached to the catheter. 4 x 40 solitaire device was then selected. Back flush was achieved at the rotating hemostatic valve, and then the device was gently  advanced through the microcatheter to the distal end. The retriever was then unsheathed by withdrawing the microcatheter under fluoroscopy. Once the retriever was completely unsheathed, control angiogram was performed from the balloon catheter. The balloon at the balloon tip catheter was then inflated under fluoroscopy for proximal flow arrest. Constant aspiration was then performed at the tip of the balloon guide catheter as the retriever was gently and slowly withdrawn with fluoroscopic observation. Once the retriever was entirely removed from the system, free aspiration was confirmed at the hub of the balloon guide catheter, with free blood return confirmed. The balloon was then deflated, rotating hemostatic valve was reattached, and a control angiogram was performed. Segment remained occluded after the first pass. Again, the microcatheter and microwire were advanced to the face of the occlusion, and through the occlusion into the angular branch. Wire was removed. Blood was then aspirated through the hub of the microcatheter, and a gentle contrast injection was performed confirming intraluminal position. A rotating hemostatic valve was then attached to the back end of the microcatheter, and a pressurized and heparinized saline bag was attached to the catheter. 4 x 40 solitaire device was then selected. Back flush was achieved at the rotating hemostatic valve, and then the device was gently advanced through the microcatheter to the distal end. The retriever was then unsheathed by withdrawing the microcatheter under fluoroscopy. Once the retriever was completely unsheathed, control angiogram was performed from the balloon catheter. Segment remained occluded after the second pass. A cat 5 intermediate catheter was then loaded with the coaxial Trevo microcatheter. Combination the microcatheter and the intermediate catheter were then advanced to the proximal aspect of the clot. Wire was navigated into the angular  branch with a microcatheter passed into the angular branch. Cat 5 catheter was advanced into the distal M1 segment. 4 x 40 solitaire device was then again selected and deployed. Mercy balloon catheter was inflated. After deployment, the cat 5 catheter was advanced under aspiration over the solitaire into the origin of the occluded segment. The entire system was then removed. Angiogram was performed, confirming restoration of TICI 3 flow. Control angiogram was performed at the right common femoral artery puncture site. The 55 centimeter 8 French sheath was removed over the roadrunner wire. Overlapping suture devices were used for closure of the right common femoral artery access. Patient tolerated the procedure well and remained hemodynamically stable throughout. Estimated blood loss from a scalp laceration is approximately 200 cc. IMPRESSION: Status post cerebral angiogram and successful mechanical thrombectomy of occluded right superior division M2 branch, with restoration of TICI 3 flow. Closure of right common femoral artery access with overlapping suture mediated devices. Signed, Yvone Neu. Loreta Ave DO [facility] Radiology PLAN: Patient extubated in IR. Transport to CT for post CT Overnight ICU admission Right leg straight overnight for hemostasis. Frequent neurovascular checks. Electronically Signed   By: Gilmer Mor D.O.   On: 10/21/2017 16:41   Ir Percutaneous Art Thrombectomy/infusion Intracranial Inc Diag Angio  Result Date: 10/27/2017 INDICATION: 83 year old female with a history of acute right MCA syndrome, normal baseline, stroke exam of 12, CT imaging showing large vessel occlusion. She has been transferred after a dose  of IV tPA, formed attempt at mechanical thrombectomy. CT performed to evaluate for ASPECTS decay shows stable aspects score, however, hemorrhagic transformation in several small peripheral regions and basal ganglia. After a thorough  discussion of risks and benefits with the Stroke Neurology team, it is felt that attempting mechanical thrombectomy will give the patient the best chance of recovery. EXAM: ULTRASOUND GUIDED ACCESS RIGHT COMMON FEMORAL ARTERY CEREBRAL ANGIOGRAM MECHANICAL THROMBECTOMY RIGHT M2 BRANCH SUTURE MEDIATED CLOSURE OF RIGHT COMMON FEMORAL ARTERY ACCESS COMPARISON:  CT imaging same day MEDICATIONS: Vancomycin 1 gm IV. The antibiotic was administered within 1 hour of the procedure ANESTHESIA/SEDATION: General anesthesia CONTRAST:  148 cc Isovue FLUOROSCOPY TIME:  Fluoroscopy Time: 56 minutes 48 seconds (1,910 mGy). COMPLICATIONS: None immediate. TECHNIQUE: Emergent consent was obtained/presumed from as there is no family available. Specific risks include: Bleeding, infection, contrast reaction, kidney injury/failure, need for further procedure/surgery, arterial injury or dissection, embolization to new territory, intracranial hemorrhage (10-15% risk), neurologic deterioration, cardiopulmonary collapse, death. All questions were addressed. Maximal Sterile Barrier Technique was utilized including during the procedure including caps, mask, sterile gowns, sterile gloves, sterile drape, hand hygiene and skin antiseptic. A timeout was performed prior to the initiation of the procedure. FINDINGS: Initial: Significant tortuosity at the aortic arch with type 3 arch Right common carotid artery: Mild tortuosity of the common carotid artery without significant plaque. Right external carotid artery: Patent with antegrade flow. Right internal carotid artery: Normal course caliber and contour of the cervical portion. Vertical and petrous segment patent with normal course caliber contour. Cavernous segment patent. Clinoid segment patent. Antegrade flow of the ophthalmic artery. Ophthalmic segment patent. Terminus patent. Right MCA:  M1 segment patent.  Occlusion of superior division. Right ACA: A 1 segment patent. A 2 segment perfuses the  right territory. Cross filling of the communicating artery with some flash filling of the left anterior cerebral territory. Final: Final angiogram demonstrates restoration of TICI3 flow of the MCA territory. PROCEDURE: Patient is brought to the interventional radiology suite with the patient's identity confirmed. Patient positioned supine position on the fluoroscopy table. General anesthesia was induced by the anesthesia team. Patient is then prepped and draped in the usual sterile fashion. Ultrasound survey of the right inguinal region was performed with images stored and sent to PACs. 11 blade scalpel was used to make a small incision. A micropuncture needle was used access the right common femoral artery under ultrasound. With excellent arterial blood flow returned, an .018 micro wire was passed through the needle, observed to enter the abdominal aorta under fluoroscopy. The needle was removed, and a micropuncture sheath was placed over the wire. The inner dilator and wire were removed, and an 035 Bentson wire was advanced under fluoroscopy into the abdominal aorta. The sheath was removed and a standard 5 [place] vascular sheath was placed. The dilator was removed and the sheath was flushed. A 40F JB-1 diagnostic catheter was advanced over the wire to the proximal descending thoracic aorta. Wire was then removed. Double flush of the catheter was performed. Catheter was then used to select the innominate artery. Angiogram was performed. Using roadmap technique, the catheter was advanced over a roadrunner wire, immediately flipping into the aortic arch given the tortuosity. Roadrunner wire was removed and a Glidewire was used. Using the Glidewire, catheter was advanced into the proximal segment of the internal carotid artery. Attempt a passing the Rosen wire was initially unsuccessful, as the diagnostic catheter withdrew into the proximal internal carotid artery. Roadrunner wire was then  passed into the distal ICA,  and the diagnostic catheter was then repositioned into the distal ICA. Rosen wire was then placed into the distal ICA. Formal angiogram was performed. The 5 French sheath was removed and exchanged for 8 French 55 centimeter BrightTip sheath. Sheath was flushed and attached to pressurized and heparinized saline bag for constant forward flow. Then an 8 [place], 85 cm Flowgate balloon tip catheter was prepared on the back table with inflation of the balloon with 50/50 concentration of dilute contrast. The balloon catheter was then advanced over the wire, an using the coaxial Bern shaped catheter, positioned into the mid internal carotid artery. Copious back flush was performed and the balloon catheter was attached to heparinized and pressurized saline bag for forward flow. Angiogram was performed for road map guide. Microcatheter system was then introduced through the balloon guide catheter, using a synchro soft 014 wire and a Trevo Provue18 catheter. Microcatheter system was advanced into the internal carotid artery, to the level of the occlusion. The micro wire was then carefully advanced through the occluded M2 segment. Microcatheter would not pass over the wire through the occluded segment without significant tension on the system, which caused the balloon guide catheter to prolapse into the aortic arch. A was necessary to removed microcatheter and microwire, and reduce the system by removal of the balloon guide catheter. JB 1 diagnostic catheter and Glidewire were then used to select the innominate artery. JB 1 catheter was advanced into the internal carotid artery over the standard Glidewire. Again, the roadrunner wire was then placed into the distal internal carotid artery an the JB 1 catheter was advanced distally to the internal carotid artery. Rosen wire was then placed to the distal internal carotid artery. Eight [place] Merci balloon guide catheter was then advanced over the Rosen wire into the internal  carotid artery, using coaxial straight catheter/introducer. Once the Merci balloon guide catheter was in place, roadmap angiogram was performed. Microcatheter and synchro soft wire were then used to navigate to the face of the occlusion. Microcatheter was then push through the occluded segment and the wire was removed. Blood was then aspirated through the hub of the microcatheter, and a gentle contrast injection was performed confirming intraluminal position. A rotating hemostatic valve was then attached to the back end of the microcatheter, and a pressurized and heparinized saline bag was attached to the catheter. 4 x 40 solitaire device was then selected. Back flush was achieved at the rotating hemostatic valve, and then the device was gently advanced through the microcatheter to the distal end. The retriever was then unsheathed by withdrawing the microcatheter under fluoroscopy. Once the retriever was completely unsheathed, control angiogram was performed from the balloon catheter. The balloon at the balloon tip catheter was then inflated under fluoroscopy for proximal flow arrest. Constant aspiration was then performed at the tip of the balloon guide catheter as the retriever was gently and slowly withdrawn with fluoroscopic observation. Once the retriever was entirely removed from the system, free aspiration was confirmed at the hub of the balloon guide catheter, with free blood return confirmed. The balloon was then deflated, rotating hemostatic valve was reattached, and a control angiogram was performed. Segment remained occluded after the first pass. Again, the microcatheter and microwire were advanced to the face of the occlusion, and through the occlusion into the angular branch. Wire was removed. Blood was then aspirated through the hub of the microcatheter, and a gentle contrast injection was performed confirming intraluminal position. A rotating  hemostatic valve was then attached to the back end of the  microcatheter, and a pressurized and heparinized saline bag was attached to the catheter. 4 x 40 solitaire device was then selected. Back flush was achieved at the rotating hemostatic valve, and then the device was gently advanced through the microcatheter to the distal end. The retriever was then unsheathed by withdrawing the microcatheter under fluoroscopy. Once the retriever was completely unsheathed, control angiogram was performed from the balloon catheter. Segment remained occluded after the second pass. A cat 5 intermediate catheter was then loaded with the coaxial Trevo microcatheter. Combination the microcatheter and the intermediate catheter were then advanced to the proximal aspect of the clot. Wire was navigated into the angular branch with a microcatheter passed into the angular branch. Cat 5 catheter was advanced into the distal M1 segment. 4 x 40 solitaire device was then again selected and deployed. [facility] balloon catheter was inflated. After deployment, the cat 5 catheter was advanced under aspiration over the solitaire into the origin of the occluded segment. The entire system was then removed. Angiogram was performed, confirming restoration of TICI 3 flow. Control angiogram was performed at the right common femoral artery puncture site. The 55 centimeter 8 French sheath was removed over the roadrunner wire. Overlapping suture devices were used for closure of the right common femoral artery access. Patient tolerated the procedure well and remained hemodynamically stable throughout. Estimated blood loss from a scalp laceration is approximately 200 cc. IMPRESSION: Status post cerebral angiogram and successful mechanical thrombectomy of occluded right superior division M2 branch, with restoration of TICI 3 flow. Closure of right common femoral artery access with overlapping suture mediated devices. Signed, Yvone Neu. Loreta Ave DO [facility] Radiology PLAN:  Patient extubated in IR. Transport to CT for post CT Overnight ICU admission Right leg straight overnight for hemostasis. Frequent neurovascular checks. Electronically Signed   By: Gilmer Mor D.O.   On: 11-01-2017 16:41       SIGNIFICANT EVENTS: MCA thrombectomy 11/4. Reversal of tpa with cryo 11/4 in response to a dropping hemoglobin   DISCUSSION: Is an 83 year old who was on Xarelto for chronic atrial fibrillation who fell and fractured her hip.  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  Given TPA transferred here for a thrombectomy which was performed.  During the thrombectomy her hemoglobin had dropped to 6.8 and she has been given cryoprecipitate for reversal.  ASSESSMENT / PLAN:  PULMONARY A: I am concerned by her airway and intermittent somnolent respiration.  I discussed this with neurology and will be intubating the patient this evening.  Repeat CT scan to evaluate for reocclusion following cryo, progressive edema or bleed will be performed this evening   CARDIOVASCULAR A: Fibrillation is rate controlled.  There is no evidence of failure at this point.  HEMATOLOGIC A: Needs frequent serial hemoglobins as the potential for bleeding into the dead space in the hip is very substantial.  Avoid secondary neurological insult related to hypotension her blood pressure will be monitored continuously with arterial line. ENDOCRINE A: History of diabetes and is been placed on sliding scale insulin.       NEUROLOGIC A: Follow-up CT is pending to rule out progressive edema reocclusion, or intracranial hemorrhage.     FAMILY   There is no next of kin other than  a stepgrandchild.  We will need to ask social services to attempt to identify an appropriate surrogate for this patient.   Penny Pia,  MD Critical Care Medicine [facility] Pager: [phone]  11/06/2017, 5:39 PM

## 2017-10-24 NOTE — Progress Notes (Addendum)
NeuroInterventional Radiology Pre-Procedure Note  History: 83 yo female [facility] for fall and hip fracture. Incidental had a scalp laceration at the time of the fall.   She was found this am ~8:56am with acute left sided weakness.  NIH of 12.    Imaging diagnosis of right MCA occlusion.    She received tPA dose given within the window for admin.   She was transferred for possible mechanical thrombectomy.   Baseline mRS: .0 NIHSS:  . 12  CT ASPECTS: .9 CTA:   .right M1 occlusion   Given the patient's symptoms, imaging findings, baseline function, I believe they are an appropriate candidate for mechanical thrombectomy.    A repeat CT was performed on arrival to assure that no ASPECTS decay occurred.  This shows similar ASPECTS profile, but with a few small areas of peripheral SAH   The risks and benefits of the procedure were unable to be meaningfully discussed with the patient, given her obtunded state.    I discussed the findings in person with Dr. Wilford CornerArora of the stroke team.  We agree that if medical care only is provided, she will likely have a very disabling if not fatal stroke involving the majority of the right MCA territory.  Proceeding in attempt to open the occlusion would give her the best chance at recovery, although there is chance of perfusing an area at risk for hemorrhage. There is no level 1 data of which we are aware on which to base our decision to treat given the small SAH, although our level 1 data supports mechanical thrombectomy.    We elected to proceed with mechanical thombectomy in order to provide her with the best chance of recovery.  Specific risks including: bleeding, infection, arterial injury/dissection, contrast reaction, kidney injury, need for further procedure/surgery, neurologic deficit, 10-15% risk of intracranial hemorrhage, cardiopulmonary collapse, death.   Plan for cerebral angiogram and attempt at mechanical thrombectomy.    Signed,  Yvone NeuJaime S. Loreta AveWagner, DO

## 2017-10-24 NOTE — Procedures (Addendum)
ADDENDUM:  Addendum created to address the time-stamps of the thrombectomy.  First Pass Date & Time: 13:51 Second Pass   14:12 Third pass   14:32 (previously time stamp was incorrectly recorded as 12:32).   Signed,  Yvone NeuJaime S. Loreta AveWagner, DO     Neuro-Interventional Radiology Post Cerebral Angiogram Procedure Note  History:  83 yo female normal baseline function [facility] with acute right MCA stroke/syndrome and proven ELVO on CTA.    Received tPA and was candidate for thrombectomy.   Baseline mRS:  0 NIHSS:   12 Last Known Well:  ~7am today.  ASPECTS:   9 Anesthesia    GETA Puncture   12:49pm First Pass Date & Time: 13:51 Second Pass   14:12 Third pass   12:32 IA tPA:    yes IA Medication:  none Proximal or Distal:  prox Post TICI Score:  TICI-3 Device:   Solitaire 4x40 (first), Solitaire 4x40 (second), Solitaire + local aspiration with Cat6 (third)  Procedure:  US guided access of right CFA, with 2 suture mediated devices for closure at completion Cerebral angiogram Mechanical thrombectomy of large vessel occlusion of M2 branch of the right MCA  Findings:  Right superior division occlusion  TICI 3 flow on the completion  Patient had bleeding from scalp wound, likely related to the tPA dose.    Complications: None  EBL ~200cc estimated from scalp wound  Recommendations: Pressure dressing on the right hip, closure device deployed To CT for noncon head CT To ICU for care overnight Right hip straight until morning Frequent NV checks  Signed,  Yvone NeuJaime S. Loreta AveWagner, DO

## 2017-10-25 ENCOUNTER — Inpatient Hospital Stay (HOSPITAL_COMMUNITY): Payer: Medicare Other

## 2017-10-25 ENCOUNTER — Other Ambulatory Visit: Payer: Self-pay

## 2017-10-25 ENCOUNTER — Encounter (HOSPITAL_COMMUNITY): Payer: Self-pay | Admitting: Interventional Radiology

## 2017-10-25 DIAGNOSIS — I615 Nontraumatic intracerebral hemorrhage, intraventricular: Secondary | ICD-10-CM

## 2017-10-25 DIAGNOSIS — I340 Nonrheumatic mitral (valve) insufficiency: Secondary | ICD-10-CM

## 2017-10-25 DIAGNOSIS — I611 Nontraumatic intracerebral hemorrhage in hemisphere, cortical: Secondary | ICD-10-CM

## 2017-10-25 DIAGNOSIS — I482 Chronic atrial fibrillation: Secondary | ICD-10-CM

## 2017-10-25 DIAGNOSIS — S72052A Unspecified fracture of head of left femur, initial encounter for closed fracture: Secondary | ICD-10-CM

## 2017-10-25 DIAGNOSIS — S0101XA Laceration without foreign body of scalp, initial encounter: Secondary | ICD-10-CM

## 2017-10-25 DIAGNOSIS — I619 Nontraumatic intracerebral hemorrhage, unspecified: Secondary | ICD-10-CM

## 2017-10-25 DIAGNOSIS — I609 Nontraumatic subarachnoid hemorrhage, unspecified: Secondary | ICD-10-CM

## 2017-10-25 DIAGNOSIS — D62 Acute posthemorrhagic anemia: Secondary | ICD-10-CM

## 2017-10-25 DIAGNOSIS — I63511 Cerebral infarction due to unspecified occlusion or stenosis of right middle cerebral artery: Principal | ICD-10-CM

## 2017-10-25 LAB — POCT I-STAT 3, ART BLOOD GAS (G3+)
ACID-BASE DEFICIT: 5 mmol/L — AB (ref 0.0–2.0)
BICARBONATE: 20.1 mmol/L (ref 20.0–28.0)
O2 Saturation: 100 %
PCO2 ART: 34.9 mmHg (ref 32.0–48.0)
PH ART: 7.366 (ref 7.350–7.450)
PO2 ART: 185 mmHg — AB (ref 83.0–108.0)
Patient temperature: 97.9
TCO2: 21 mmol/L — ABNORMAL LOW (ref 22–32)

## 2017-10-25 LAB — CBC WITH DIFFERENTIAL/PLATELET
BASOS PCT: 0 %
BASOS PCT: 0 %
Basophils Absolute: 0 10*3/uL (ref 0.0–0.1)
Basophils Absolute: 0 10*3/uL (ref 0.0–0.1)
Basophils Absolute: 0 10*3/uL (ref 0.0–0.1)
Basophils Absolute: 0 10*3/uL (ref 0.0–0.1)
Basophils Relative: 0 %
Basophils Relative: 0 %
EOS ABS: 0 10*3/uL (ref 0.0–0.7)
EOS ABS: 0 10*3/uL (ref 0.0–0.7)
EOS ABS: 0.1 10*3/uL (ref 0.0–0.7)
EOS PCT: 0 %
EOS PCT: 1 %
Eosinophils Absolute: 0 10*3/uL (ref 0.0–0.7)
Eosinophils Relative: 0 %
Eosinophils Relative: 1 %
HCT: 22.2 % — ABNORMAL LOW (ref 36.0–46.0)
HCT: 22 % — ABNORMAL LOW (ref 36.0–46.0)
HEMATOCRIT: 24.9 % — AB (ref 36.0–46.0)
HEMATOCRIT: 22.4 % — AB (ref 36.0–46.0)
HEMOGLOBIN: 8.6 g/dL — AB (ref 12.0–15.0)
HEMOGLOBIN: 7.7 g/dL — AB (ref 12.0–15.0)
HEMOGLOBIN: 7.7 g/dL — AB (ref 12.0–15.0)
Hemoglobin: 7.6 g/dL — ABNORMAL LOW (ref 12.0–15.0)
LYMPHS ABS: 0.7 10*3/uL (ref 0.7–4.0)
LYMPHS ABS: 0.6 10*3/uL — AB (ref 0.7–4.0)
LYMPHS ABS: 0.6 10*3/uL — AB (ref 0.7–4.0)
LYMPHS ABS: 0.6 10*3/uL — AB (ref 0.7–4.0)
LYMPHS PCT: 11 %
LYMPHS PCT: 10 %
Lymphocytes Relative: 10 %
Lymphocytes Relative: 10 %
MCH: 29.9 pg (ref 26.0–34.0)
MCH: 30.1 pg (ref 26.0–34.0)
MCH: 29.7 pg (ref 26.0–34.0)
MCH: 30.2 pg (ref 26.0–34.0)
MCHC: 34.5 g/dL (ref 30.0–36.0)
MCHC: 34.7 g/dL (ref 30.0–36.0)
MCHC: 34.4 g/dL (ref 30.0–36.0)
MCHC: 34.5 g/dL (ref 30.0–36.0)
MCV: 86.5 fL (ref 78.0–100.0)
MCV: 86.7 fL (ref 78.0–100.0)
MCV: 86.5 fL (ref 78.0–100.0)
MCV: 87.3 fL (ref 78.0–100.0)
MONO ABS: 0.6 10*3/uL (ref 0.1–1.0)
MONO ABS: 0.8 10*3/uL (ref 0.1–1.0)
MONOS PCT: 11 %
MONOS PCT: 11 %
MONOS PCT: 11 %
MONOS PCT: 14 %
Monocytes Absolute: 0.7 10*3/uL (ref 0.1–1.0)
Monocytes Absolute: 0.7 10*3/uL (ref 0.1–1.0)
NEUTROS ABS: 5.4 10*3/uL (ref 1.7–7.7)
NEUTROS ABS: 4.7 10*3/uL (ref 1.7–7.7)
NEUTROS PCT: 78 %
NEUTROS PCT: 79 %
NEUTROS PCT: 79 %
NEUTROS PCT: 74 %
Neutro Abs: 4.9 10*3/uL (ref 1.7–7.7)
Neutro Abs: 4.2 10*3/uL (ref 1.7–7.7)
PLATELETS: 96 10*3/uL — AB (ref 150–400)
Platelets: 85 10*3/uL — ABNORMAL LOW (ref 150–400)
Platelets: 84 10*3/uL — ABNORMAL LOW (ref 150–400)
Platelets: 95 10*3/uL — ABNORMAL LOW (ref 150–400)
RBC: 2.88 MIL/uL — AB (ref 3.87–5.11)
RBC: 2.56 MIL/uL — ABNORMAL LOW (ref 3.87–5.11)
RBC: 2.59 MIL/uL — ABNORMAL LOW (ref 3.87–5.11)
RBC: 2.52 MIL/uL — ABNORMAL LOW (ref 3.87–5.11)
RDW: 14.2 % (ref 11.5–15.5)
RDW: 14.5 % (ref 11.5–15.5)
RDW: 14.7 % (ref 11.5–15.5)
RDW: 14.9 % (ref 11.5–15.5)
WBC: 6.9 10*3/uL (ref 4.0–10.5)
WBC: 6.2 10*3/uL (ref 4.0–10.5)
WBC: 6 10*3/uL (ref 4.0–10.5)
WBC: 5.7 10*3/uL (ref 4.0–10.5)

## 2017-10-25 LAB — BPAM RBC
BLOOD PRODUCT EXPIRATION DATE: 201811122359
Blood Product Expiration Date: 201811122359
ISSUE DATE / TIME: 201811042012
ISSUE DATE / TIME: 201811041710
Unit Type and Rh: 7300
Unit Type and Rh: 7300

## 2017-10-25 LAB — ECHOCARDIOGRAM COMPLETE
AOASC: 29 cm
AVPHT: 283 ms
CHL CUP REG VEL DIAS: 119 cm/s
EERAT: 11.39
EWDT: 211 ms
FS: 32 % (ref 28–44)
IVS/LV PW RATIO, ED: 1.1
LA ID, A-P, ES: 35 mm
LA vol index: 37.8 mL/m2
LA vol: 68 mL
LADIAMINDEX: 1.94 cm/m2
LAVOLA4C: 52.9 mL
LEFT ATRIUM END SYS DIAM: 35 mm
LV E/e' medial: 11.39
LV E/e'average: 11.39
LV PW d: 7.45 mm — AB (ref 0.6–1.1)
LV TDI E'LATERAL: 10.1
LV e' LATERAL: 10.1 cm/s
LVOT area: 2.84 cm2
LVOTD: 19 mm
MV Dec: 211
MV pk E vel: 115 m/s
MVPG: 5 mmHg
MVPKAVEL: 46.1 m/s
PV Reg grad dias: 6 mmHg
RV LATERAL S' VELOCITY: 10.4 cm/s
RV TAPSE: 15.7 mm
RV sys press: 62 mmHg
Reg peak vel: 342 cm/s
TDI e' medial: 8.1
TRMAXVEL: 342 cm/s

## 2017-10-25 LAB — BPAM CRYOPRECIPITATE
BLOOD PRODUCT EXPIRATION DATE: 201811042326
Blood Product Expiration Date: 201811042254
ISSUE DATE / TIME: 201811041717
ISSUE DATE / TIME: 201811041751
UNIT TYPE AND RH: 5100
Unit Type and Rh: 5100

## 2017-10-25 LAB — POCT I-STAT 7, (LYTES, BLD GAS, ICA,H+H)
ACID-BASE DEFICIT: 5 mmol/L — AB (ref 0.0–2.0)
BICARBONATE: 21 mmol/L (ref 20.0–28.0)
CALCIUM ION: 1.1 mmol/L — AB (ref 1.15–1.40)
HEMATOCRIT: 19 % — AB (ref 36.0–46.0)
Hemoglobin: 6.5 g/dL — CL (ref 12.0–15.0)
O2 Saturation: 100 %
PH ART: 7.374 (ref 7.350–7.450)
POTASSIUM: 3.1 mmol/L — AB (ref 3.5–5.1)
SODIUM: 135 mmol/L (ref 135–145)
TCO2: 22 mmol/L (ref 22–32)
pCO2 arterial: 34.8 mmHg (ref 32.0–48.0)
pO2, Arterial: 275 mmHg — ABNORMAL HIGH (ref 83.0–108.0)

## 2017-10-25 LAB — PREPARE CRYOPRECIPITATE
Unit division: 0
Unit division: 0

## 2017-10-25 LAB — LIPID PANEL
CHOL/HDL RATIO: 2.6 ratio
Cholesterol: 108 mg/dL (ref 0–200)
HDL: 41 mg/dL (ref >40)
LDL CALC: 51 mg/dL (ref 0–99)
TRIGLYCERIDES: 79 mg/dL (ref <150)
VLDL: 16 mg/dL (ref 0–40)

## 2017-10-25 LAB — TYPE AND SCREEN
ABO/RH(D): B POS
ANTIBODY SCREEN: NEGATIVE
Unit division: 0
Unit division: 0

## 2017-10-25 LAB — SODIUM
SODIUM: 133 mmol/L — AB (ref 135–145)
SODIUM: 136 mmol/L (ref 135–145)
Sodium: 141 mmol/L (ref 135–145)
Sodium: 143 mmol/L (ref 135–145)

## 2017-10-25 LAB — MAGNESIUM: MAGNESIUM: 2.5 mg/dL — AB (ref 1.7–2.4)

## 2017-10-25 LAB — GLUCOSE, CAPILLARY
GLUCOSE-CAPILLARY: 133 mg/dL — AB (ref 65–99)
GLUCOSE-CAPILLARY: 122 mg/dL — AB (ref 65–99)
GLUCOSE-CAPILLARY: 137 mg/dL — AB (ref 65–99)
GLUCOSE-CAPILLARY: 118 mg/dL — AB (ref 65–99)

## 2017-10-25 LAB — BASIC METABOLIC PANEL
Anion gap: 10 (ref 5–15)
BUN: 13 mg/dL (ref 6–20)
CHLORIDE: 108 mmol/L (ref 101–111)
CO2: 18 mmol/L — AB (ref 22–32)
Calcium: 7.8 mg/dL — ABNORMAL LOW (ref 8.9–10.3)
Creatinine, Ser: 0.71 mg/dL (ref 0.44–1.00)
GFR calc Af Amer: >60 mL/min (ref >60)
GFR calc non Af Amer: >60 mL/min (ref >60)
Glucose, Bld: 124 mg/dL — ABNORMAL HIGH (ref 65–99)
POTASSIUM: 4.1 mmol/L (ref 3.5–5.1)
Sodium: 136 mmol/L (ref 135–145)

## 2017-10-25 LAB — PHOSPHORUS: Phosphorus: 2.7 mg/dL (ref 2.5–4.6)

## 2017-10-25 LAB — HEMOGLOBIN A1C
Hgb A1c MFr Bld: 5.8 % — ABNORMAL HIGH (ref 4.8–5.6)
Mean Plasma Glucose: 119.76 mg/dL

## 2017-10-25 MED ORDER — CHLORHEXIDINE GLUCONATE 0.12 % MT SOLN
15.0000 mL | Freq: Two times a day (BID) | OROMUCOSAL | Status: DC
  Administered 2017-10-25 – 2017-10-27 (×4): 15 mL via OROMUCOSAL
  Filled 2017-10-25: qty 15

## 2017-10-25 MED ORDER — SODIUM CHLORIDE 0.9 % IV BOLUS (SEPSIS)
500.0000 mL | Freq: Once | INTRAVENOUS | Status: AC
  Administered 2017-10-25: 500 mL via INTRAVENOUS

## 2017-10-25 MED ORDER — LABETALOL HCL 5 MG/ML IV SOLN
10.0000 mg | INTRAVENOUS | Status: DC | PRN
  Administered 2017-10-25 – 2017-10-27 (×6): 10 mg via INTRAVENOUS
  Filled 2017-10-25 (×6): qty 4

## 2017-10-25 MED ORDER — BACITRACIN-NEOMYCIN-POLYMYXIN OINTMENT TUBE
TOPICAL_OINTMENT | Freq: Every day | CUTANEOUS | Status: DC
  Administered 2017-10-25 – 2017-10-27 (×3): via TOPICAL
  Filled 2017-10-25: qty 14.17

## 2017-10-25 MED ORDER — ORAL CARE MOUTH RINSE
15.0000 mL | Freq: Two times a day (BID) | OROMUCOSAL | Status: DC
  Administered 2017-10-26 (×2): 15 mL via OROMUCOSAL

## 2017-10-25 MED ORDER — ORAL CARE MOUTH RINSE
15.0000 mL | Freq: Two times a day (BID) | OROMUCOSAL | Status: DC
  Administered 2017-10-25: 15 mL via OROMUCOSAL

## 2017-10-25 MED ORDER — CHLORHEXIDINE GLUCONATE CLOTH 2 % EX PADS
6.0000 | MEDICATED_PAD | Freq: Every day | CUTANEOUS | Status: DC
  Administered 2017-10-25 – 2017-10-27 (×3): 6 via TOPICAL

## 2017-10-25 MED ORDER — FENTANYL CITRATE (PF) 100 MCG/2ML IJ SOLN
25.0000 ug | INTRAMUSCULAR | Status: DC | PRN
  Administered 2017-10-25 – 2017-10-26 (×6): 25 ug via INTRAVENOUS
  Filled 2017-10-25 (×6): qty 2

## 2017-10-25 MED ORDER — LABETALOL HCL 5 MG/ML IV SOLN
10.0000 mg | INTRAVENOUS | Status: DC | PRN
  Administered 2017-10-25 (×3): 10 mg via INTRAVENOUS
  Filled 2017-10-25 (×3): qty 4

## 2017-10-25 MED ORDER — SODIUM CHLORIDE 3 % IV SOLN
INTRAVENOUS | Status: DC
  Administered 2017-10-25 – 2017-10-26 (×4): 50 mL/h via INTRAVENOUS
  Filled 2017-10-25 (×12): qty 500

## 2017-10-25 NOTE — Procedures (Signed)
Central line placement  A central line was placed for the administration of hypertonic saline.  As she has no surrogate the procedure was performed as medically indicated.  A timeout was performed.  The area over the left subclavian was extensively prepped with chlorhexidine then widely draped.  Sterile garb was donned and the left subclavian vein easily cannulated. A wire was gently passed and the tract dilated.  A 20 cm triple-lumen 7 French catheter was passed over the wire.  There was good flow from all ports and the catheter was sutured in place at 20 cm.  Chest x-ray shows good placement and no pneumothorax

## 2017-10-25 NOTE — Progress Notes (Signed)
PULMONARY / CRITICAL CARE MEDICINE   Name: Carol Parker MRN: [id number] DOB: 04-18-1929    ADMISSION DATE:  06-Nov-2017   CHIEF COMPLAINT:  Left hemiparesis  HISTORY OF PRESENT ILLNESS:   This is an 83 year old who normally takes Xeralto for chronic atrial fibrillation.  She fell on 11/2 and fractured her left hip.  Xarelto was held anticipating surgery on her hip but on 11/4 she developed a left hemiparesis.  She was given TPA and transferred to this hospital where a thrombectomy for a right MCA occlusion was performed.  following the thrombectomy her hemoglobin had dropped substantially and she received 2 units of packed red blood cells and the TPA was reversed with cryoprecipitate.  She has had no further transfusion overnight.  There was initially some concern about her protecting her airway however through the night she has been easy to awaken it has not had any airway issues.  She transiently required antihypertensives.  She remains lethargic and only speaks after sternal rub this morning.  She is not conveying any significant history.  Follow-up CT scan has shown some mass-effect from her stroke with a 4.6 mm midline shift.  3% saline has been ordered.  PAST MEDICAL HISTORY :  DM, atrial fibrillation  PAST SURGICAL HISTORY: She  has a past surgical history that includes IR PERCUTANEOUS ART THROMBECTOMY/INFUSION INTRACRANIAL INC DIAG ANGIO (November 06, 2017) and IR US Guide Vasc Access Right (2017-11-06).  Allergies  Allergen Reactions  . Penicillins Rash    No current facility-administered medications on file prior to encounter.    Current Outpatient Medications on File Prior to Encounter  Medication Sig  . albuterol (PROVENTIL HFA;VENTOLIN HFA) 108 (90 Base) MCG/ACT inhaler Inhale 2 puffs every 4 (four) hours as needed into the lungs for wheezing or shortness of breath.   . losartan (COZAAR) 50 MG tablet Take 50 mg daily by mouth.  . metFORMIN (GLUCOPHAGE-XR) 500 MG 24 hr tablet Take 500  mg daily by mouth.  . metoprolol succinate (TOPROL-XL) 50 MG 24 hr tablet Take 50 mg daily by mouth. Take with or immediately following a meal.  . terbinafine (LAMISIL) 250 MG tablet Take 250 mg every other day by mouth.  . triamcinolone cream (KENALOG) 0.5 % Apply 1 application See admin instructions topically. Filled 07/19/17: apply topically to feet three times daily for 3 weeks, stop for 1 week, then repeat process  . doxycycline (VIBRA-TABS) 100 MG tablet Take 100 mg 2 (two) times daily by mouth. 10 day course filled 10/04/17  . furosemide (LASIX) 20 MG tablet Take 20 mg See admin instructions by mouth. Filled 10/05/17: take 1 tablet (20 mg) by mouth daily for 5 days  . lidocaine (XYLOCAINE) 5 % ointment Apply 1 application topically 4 (four) times daily as needed.  . Rivaroxaban (XARELTO) 15 MG TABS tablet Take 15 mg daily by mouth.   . terbinafine (LAMISIL) 1 % cream Apply 1 application topically 2 (two) times daily.    FAMILY HISTORY:  Her has no family status information on file.    SOCIAL HISTORY: She  has an unknown smoking status. she has never used smokeless tobacco.  REVIEW OF SYSTEMS:   Not obtainable  SUBJECTIVE:  As above  VITAL SIGNS: BP (!) 95/53   Pulse 71   Temp 97.6 F (36.4 C) (Axillary)   Resp 13   SpO2 100%   HEMODYNAMICS:    VENTILATOR SETTINGS:    INTAKE / OUTPUT: I/O last 3 completed shifts: In: 3897.2 [I.V.:1876.8;  Blood:1120.4; IV Piggyback:900] Out: 2225 [Urine:1225; Blood:1000]  PHYSICAL EXAMINATION: General: Elderly looking white female with a dressing on her head with dried blood. Neuro: No response to voice.  Some mumbled words in response to sternal rub.  Purposeful with her right upper extremity to sternal rub.  Movement on the left.  Nipples are equal and reactive at 3 mm.  There is a left facial droop. Cardiovascular: No JVD.  S1 and S2 are regular regular without murmur or gallop.  The feet are pink and warm without dependent  edema. Lungs: There is symmetric air movement respirations are unlabored.  There are no wheezes there are a few scattered rhonchi. Abdomen: Soft without any organomegaly masses tenderness guarding or rebound.  Right groin access site is dry without hematoma. Musculoskeletal: She has some slight external rotation of the left foot.  No significant ecchymoses over the left hip.   LABS:  BMET Recent Labs  Lab 11/04/2017 1504 10/23/2017 2102 10/25/17 0136 10/25/17 0510  NA 135 132* 133* 136  K 3.1* 3.6  --  4.1  CL  --  106  --  108  CO2  --  19*  --  18*  BUN  --  13  --  13  CREATININE  --  0.71  --  0.71  GLUCOSE  --  144*  --  124*    Electrolytes Recent Labs  Lab 11/12/2017 2102 10/25/17 0510  CALCIUM 7.6* 7.8*  MG 1.3* 2.5*  PHOS 3.2 2.7    CBC Recent Labs  Lab 10/25/2017 2102 11/19/2017 2322 10/25/17 0510  WBC 7.6 6.7 6.9  HGB 8.1* 9.0* 8.6*  HCT 23.9* 25.7* 24.9*  PLT 90* 78* 85*    Coag's Recent Labs  Lab 11/19/2017 2102  INR 1.38    Sepsis Markers No results for input(s): LATICACIDVEN, PROCALCITON, O2SATVEN in the last 168 hours.  ABG Recent Labs  Lab 10/26/2017 1504 10/23/2017 2117  PHART 7.374 7.366  PCO2ART 34.8 34.9  PO2ART 275.0* 185.0*    Liver Enzymes No results for input(s): AST, ALT, ALKPHOS, BILITOT, ALBUMIN in the last 168 hours.  Cardiac Enzymes No results for input(s): TROPONINI, PROBNP in the last 168 hours.  Glucose Recent Labs  Lab 10/27/2017 1643 11/18/2017 2216 10/25/17 0725  GLUCAP 117* 118* 133*    Imaging Ct Head Wo Contrast  Result Date: 10/25/2017 CLINICAL DATA:  Altered level of consciousness. History of RIGHT ICA occlusion, mechanical thrombectomy of the M2 occlusion. EXAM: CT HEAD WITHOUT CONTRAST TECHNIQUE: Contiguous axial images were obtained from the base of the skull through the vertex without intravenous contrast. COMPARISON:  Multiple CT October 24, 2017 back or FINDINGS: BRAIN: Evolving RIGHT frontal lobe  intraparenchymal hematoma with marginal a hazy density. Smaller RIGHT basal ganglia hemorrhage, less conspicuous than prior CT. Small similar RIGHT occipital lobe hemorrhage. Smaller RIGHT temporal lobe indistinct hemorrhage. Subcentimeter LEFT insular hemorrhage versus subarachnoid blood products. Increased intraventricular blood products. 5 mm RIGHT to LEFT midline shift, decreased from 7 mm. Mild similar LEFT ventricular entrapment. Subarachnoid hemorrhage RIGHT basal cistern. Mild RIGHT uncal herniation. VASCULAR: Mild calcific atherosclerosis of the carotid siphons. SKULL: No skull fracture. Moderate LEFT parietoccipital scalp hematoma. SINUSES/ORBITS: Severe chronic pan paranasal sinusitis. Mastoid air cells are well aerated.The included ocular globes and orbital contents are non-suspicious. OTHER: None. IMPRESSION: 1. Multifocal intraparenchymal hemorrhage, some of which appear smaller most compatible with a component of resolving contrast staining. 2. Increasing intraventricular blood products with mild LEFT ventricle entrapment. 5 mm RIGHT to  LEFT midline shift, decreased from 7 mm. 3. Small volume scattered subarachnoid hemorrhage. 4. Moderate LEFT parietoccipital scalp hematoma. Electronically Signed   By: Awilda Metro M.D.   On: 10/25/2017 02:03   Ct Head Wo Contrast  Result Date: Oct 26, 2017 CLINICAL DATA:  Status post revascularization of the right middle cerebral artery. EXAM: CT HEAD WITHOUT CONTRAST TECHNIQUE: Contiguous axial images were obtained from the base of the skull through the vertex without intravenous contrast. COMPARISON:  CT head without contrast from the same day at 11:50 a.m. FINDINGS: Brain: There significant increase in size and number of multiple foci of parenchymal hemorrhage. The largest is in the right frontal lobe, measuring 3.6 x 3.8 x 6.0 cm. There is hemorrhage into the right basal ganglia and anterior limb internal capsule. Right occipital lobe hemorrhages noted.  Hemorrhage in the medial right temporal lobe measures 3.4 x 2.5 x 4.8 cm. Diffuse subarachnoid hemorrhage is present involving the suprasellar cisterns and along the tentorium. Minimal blood is noted within the ventricles, posteriorly on the left. There is some dilation of the left temporal tip suggesting early hydrocephalus. The para mesencephalic cisterns are opacified. Right-to-left midline shift measures 6.5 mm. Vascular: Atherosclerotic calcifications are present within the cavernous internal carotid artery is bilaterally. There is no residual hyperdense vessel. Skull: Calvarium is intact. Sinuses/Orbits: Chronic sinus disease is again seen. Bilateral lens replacements are noted. IMPRESSION: 1. Multiple sites of parenchymal hemorrhage involving the right frontal lobe, right basal ganglia, and right temporal lobe. 2. Progressive hemorrhage in the right occipital lobe. 3. Extensive effacement of the sulci over the right hemispheric and 6 mm of right-to-left midline shift. 4. Diffuse subarachnoid hemorrhage is now present. 5. Intraventricular hemorrhages noted with early dilation of the left temporal tip. Critical Value/emergent results were called by telephone at the time of interpretation on 10-26-2017 at 4:36 pm to Dr. Gilmer Mor , who verbally acknowledged these results. Electronically Signed   By: Marin Roberts M.D.   On: Oct 26, 2017 16:38   Ct Head Wo Contrast  Result Date: 10-26-2017 CLINICAL DATA:  83 year old female transferred from [facility] for emergent large vessel occlusion, right ICA terminus and right MCA. Now status post IV tPA, and transfer to [facility]. EXAM: CT HEAD WITHOUT CONTRAST TECHNIQUE: Contiguous axial images were obtained from the base of the skull through the vertex without intravenous contrast. COMPARISON:  CTA head and neck and noncontrast head CT [facility] 0903 hours today. FINDINGS: Brain: Along the medial aspect of the right lentiform  hypodensity seen earlier today there is now crescent-shaped and other scattered abnormal hyperdensity versus enhancement. There is more curvilinear abnormal hyperdensity or enhancement in the left inferior frontal gyrus (series 3, image 16). There is more confluent hyperdensity or enhancement in the mesial right temporal lobe (image 14) and also new curvilinear hyperdensity also at the right occipital pole (same image). There is also trace curvilinear hyperdensity along the more superior right frontal convexity (image 22). No associated mass effect. No intraventricular hemorrhage. No other extra-axial hemorrhage. No new areas of cytotoxic edema identified. Left hemisphere and posterior fossa Marcellius Montagna-white matter differentiation is stable. Vascular: Residual intravascular contrast is present. Calcified atherosclerosis at the skull base. Skull: No acute osseous abnormality identified. Sinuses/Orbits: Stable chronic sinusitis findings. Tympanic cavities and mastoids remain clear. Other: The patient has a broad-based left posterior convexity scalp hematoma measuring up to 7 mm in thickness which is new or increased from the CTA head earlier today (series 4, image 35). The underlying calvarium  is intact. Other scalp soft tissues appear stable and within normal limits. No acute orbit soft tissue findings. IMPRESSION: 1. Small new multifocal areas of abnormal hyperdensity in the right hemisphere. Although some of these in the right MCA territory might reflect post ischemic contrast staining, there is at least a small volume of acute hemorrhage, including that seen at the right occipital pole which is probably in the subarachnoid space. This was discussed in person with Dr. Milon DikesASHISH ARORA on 11/18/2017 at 1200 hours. 2. No intracranial mass effect. No new areas of cytotoxic edema in the right MCA territory. 3. New or increased left posterior scalp hematoma since 0902 hours today, 7 mm in thickness. No underlying skull fracture.  Electronically Signed   By: Odessa FlemingH  Hall M.D.   On: May 14, 2017 12:44   Ir [place] Guide Vasc Access Right  Result Date: 10/21/2017 INDICATION: 83 year old female with a history of acute right MCA syndrome, normal baseline, stroke exam of 12, CT imaging showing large vessel occlusion. She has been transferred after a dose of IV tPA, formed attempt at mechanical thrombectomy. CT performed to evaluate for ASPECTS decay shows stable aspects score, however, hemorrhagic transformation in several small peripheral regions and basal ganglia. After a thorough discussion of risks and benefits with the Stroke Neurology team, it is felt that attempting mechanical thrombectomy will give the patient the best chance of recovery. EXAM: ULTRASOUND GUIDED ACCESS RIGHT COMMON FEMORAL ARTERY CEREBRAL ANGIOGRAM MECHANICAL THROMBECTOMY RIGHT M2 BRANCH SUTURE MEDIATED CLOSURE OF RIGHT COMMON FEMORAL ARTERY ACCESS COMPARISON:  CT imaging same day MEDICATIONS: Vancomycin 1 gm IV. The antibiotic was administered within 1 hour of the procedure ANESTHESIA/SEDATION: General anesthesia CONTRAST:  148 cc Isovue FLUOROSCOPY TIME:  Fluoroscopy Time: 56 minutes 48 seconds (1,910 mGy). COMPLICATIONS: None immediate. TECHNIQUE: Emergent consent was obtained/presumed from as there is no family available. Specific risks include: Bleeding, infection, contrast reaction, kidney injury/failure, need for further procedure/surgery, arterial injury or dissection, embolization to new territory, intracranial hemorrhage (10-15% risk), neurologic deterioration, cardiopulmonary collapse, death. All questions were addressed. Maximal Sterile Barrier Technique was utilized including during the procedure including caps, mask, sterile gowns, sterile gloves, sterile drape, hand hygiene and skin antiseptic. A timeout was performed prior to the initiation of the procedure. FINDINGS: Initial: Significant tortuosity at the aortic arch with type 3 arch Right common carotid artery:  Mild tortuosity of the common carotid artery without significant plaque. Right external carotid artery: Patent with antegrade flow. Right internal carotid artery: Normal course caliber and contour of the cervical portion. Vertical and petrous segment patent with normal course caliber contour. Cavernous segment patent. Clinoid segment patent. Antegrade flow of the ophthalmic artery. Ophthalmic segment patent. Terminus patent. Right MCA:  M1 segment patent.  Occlusion of superior division. Right ACA: A 1 segment patent. A 2 segment perfuses the right territory. Cross filling of the communicating artery with some flash filling of the left anterior cerebral territory. Final: Final angiogram demonstrates restoration of TICI3 flow of the MCA territory. PROCEDURE: Patient is brought to the interventional radiology suite with the patient's identity confirmed. Patient positioned supine position on the fluoroscopy table. General anesthesia was induced by the anesthesia team. Patient is then prepped and draped in the usual sterile fashion. Ultrasound survey of the right inguinal region was performed with images stored and sent to PACs. 11 blade scalpel was used to make a small incision. A micropuncture needle was used access the right common femoral artery under ultrasound. With excellent arterial blood flow returned, an .018  micro wire was passed through the needle, observed to enter the abdominal aorta under fluoroscopy. The needle was removed, and a micropuncture sheath was placed over the wire. The inner dilator and wire were removed, and an 035 Bentson wire was advanced under fluoroscopy into the abdominal aorta. The sheath was removed and a standard 5 [place] vascular sheath was placed. The dilator was removed and the sheath was flushed. A 74F JB-1 diagnostic catheter was advanced over the wire to the proximal descending thoracic aorta. Wire was then removed. Double flush of the catheter was performed. Catheter was then  used to select the innominate artery. Angiogram was performed. Using roadmap technique, the catheter was advanced over a roadrunner wire, immediately flipping into the aortic arch given the tortuosity. Roadrunner wire was removed and a Glidewire was used. Using the Glidewire, catheter was advanced into the proximal segment of the internal carotid artery. Attempt a passing the Rosen wire was initially unsuccessful, as the diagnostic catheter withdrew into the proximal internal carotid artery. Roadrunner wire was then passed into the distal ICA, and the diagnostic catheter was then repositioned into the distal ICA. Rosen wire was then placed into the distal ICA. Formal angiogram was performed. The 5 French sheath was removed and exchanged for 8 French 55 centimeter BrightTip sheath. Sheath was flushed and attached to pressurized and heparinized saline bag for constant forward flow. Then an 8 [place], 85 cm Flowgate balloon tip catheter was prepared on the back table with inflation of the balloon with 50/50 concentration of dilute contrast. The balloon catheter was then advanced over the wire, an using the coaxial Bern shaped catheter, positioned into the mid internal carotid artery. Copious back flush was performed and the balloon catheter was attached to heparinized and pressurized saline bag for forward flow. Angiogram was performed for road map guide. Microcatheter system was then introduced through the balloon guide catheter, using a synchro soft 014 wire and a Trevo Provue18 catheter. Microcatheter system was advanced into the internal carotid artery, to the level of the occlusion. The micro wire was then carefully advanced through the occluded M2 segment. Microcatheter would not pass over the wire through the occluded segment without significant tension on the system, which caused the balloon guide catheter to prolapse into the aortic arch. A was necessary to removed microcatheter and microwire, and reduce the  system by removal of the balloon guide catheter. JB 1 diagnostic catheter and Glidewire were then used to select the innominate artery. JB 1 catheter was advanced into the internal carotid artery over the standard Glidewire. Again, the roadrunner wire was then placed into the distal internal carotid artery an the JB 1 catheter was advanced distally to the internal carotid artery. Rosen wire was then placed to the distal internal carotid artery. Eight [place] Merci balloon guide catheter was then advanced over the Rosen wire into the internal carotid artery, using coaxial straight catheter/introducer. Once the Merci balloon guide catheter was in place, roadmap angiogram was performed. Microcatheter and synchro soft wire were then used to navigate to the face of the occlusion. Microcatheter was then push through the occluded segment and the wire was removed. Blood was then aspirated through the hub of the microcatheter, and a gentle contrast injection was performed confirming intraluminal position. A rotating hemostatic valve was then attached to the back end of the microcatheter, and a pressurized and heparinized saline bag was attached to the catheter. 4 x 40 solitaire device was then selected. Back flush was achieved at the  rotating hemostatic valve, and then the device was gently advanced through the microcatheter to the distal end. The retriever was then unsheathed by withdrawing the microcatheter under fluoroscopy. Once the retriever was completely unsheathed, control angiogram was performed from the balloon catheter. The balloon at the balloon tip catheter was then inflated under fluoroscopy for proximal flow arrest. Constant aspiration was then performed at the tip of the balloon guide catheter as the retriever was gently and slowly withdrawn with fluoroscopic observation. Once the retriever was entirely removed from the system, free aspiration was confirmed at the hub of the balloon guide catheter, with free  blood return confirmed. The balloon was then deflated, rotating hemostatic valve was reattached, and a control angiogram was performed. Segment remained occluded after the first pass. Again, the microcatheter and microwire were advanced to the face of the occlusion, and through the occlusion into the angular branch. Wire was removed. Blood was then aspirated through the hub of the microcatheter, and a gentle contrast injection was performed confirming intraluminal position. A rotating hemostatic valve was then attached to the back end of the microcatheter, and a pressurized and heparinized saline bag was attached to the catheter. 4 x 40 solitaire device was then selected. Back flush was achieved at the rotating hemostatic valve, and then the device was gently advanced through the microcatheter to the distal end. The retriever was then unsheathed by withdrawing the microcatheter under fluoroscopy. Once the retriever was completely unsheathed, control angiogram was performed from the balloon catheter. Segment remained occluded after the second pass. A cat 5 intermediate catheter was then loaded with the coaxial Trevo microcatheter. Combination the microcatheter and the intermediate catheter were then advanced to the proximal aspect of the clot. Wire was navigated into the angular branch with a microcatheter passed into the angular branch. Cat 5 catheter was advanced into the distal M1 segment. 4 x 40 solitaire device was then again selected and deployed. Mercy balloon catheter was inflated. After deployment, the cat 5 catheter was advanced under aspiration over the solitaire into the origin of the occluded segment. The entire system was then removed. Angiogram was performed, confirming restoration of TICI 3 flow. Control angiogram was performed at the right common femoral artery puncture site. The 55 centimeter 8 French sheath was removed over the roadrunner wire. Overlapping suture devices were used for closure of  the right common femoral artery access. Patient tolerated the procedure well and remained hemodynamically stable throughout. Estimated blood loss from a scalp laceration is approximately 200 cc. IMPRESSION: Status post cerebral angiogram and successful mechanical thrombectomy of occluded right superior division M2 branch, with restoration of TICI 3 flow. Closure of right common femoral artery access with overlapping suture mediated devices. Signed, Yvone Neu. Loreta Ave DO [facility] Radiology PLAN: Patient extubated in IR. Transport to CT for post CT Overnight ICU admission Right leg straight overnight for hemostasis. Frequent neurovascular checks. Electronically Signed   By: Gilmer Mor D.O.   On: 11/11/2017 16:41   Dg Chest Port 1 View  Result Date: 10/25/2017 CLINICAL DATA:  Abnormal chest x-ray EXAM: PORTABLE CHEST 1 VIEW COMPARISON:  06/08/2017 FINDINGS: Mild to moderate cardiomegaly. Aortic atherosclerosis. No consolidation or effusion. No pneumothorax. IMPRESSION: Cardiomegaly without edema or infiltrate. Electronically Signed   By: Jasmine Pang M.D.   On: 10/25/2017 03:45   Ir Percutaneous Art Thrombectomy/infusion Intracranial Inc Diag Angio  Result Date: 11/16/2017 INDICATION: 83 year old female with a history of acute right MCA syndrome, normal baseline, stroke  exam of 12, CT imaging showing large vessel occlusion. She has been transferred after a dose of IV tPA, formed attempt at mechanical thrombectomy. CT performed to evaluate for ASPECTS decay shows stable aspects score, however, hemorrhagic transformation in several small peripheral regions and basal ganglia. After a thorough discussion of risks and benefits with the Stroke Neurology team, it is felt that attempting mechanical thrombectomy will give the patient the best chance of recovery. EXAM: ULTRASOUND GUIDED ACCESS RIGHT COMMON FEMORAL ARTERY CEREBRAL ANGIOGRAM MECHANICAL THROMBECTOMY  RIGHT M2 BRANCH SUTURE MEDIATED CLOSURE OF RIGHT COMMON FEMORAL ARTERY ACCESS COMPARISON:  CT imaging same day MEDICATIONS: Vancomycin 1 gm IV. The antibiotic was administered within 1 hour of the procedure ANESTHESIA/SEDATION: General anesthesia CONTRAST:  148 cc Isovue FLUOROSCOPY TIME:  Fluoroscopy Time: 56 minutes 48 seconds (1,910 mGy). COMPLICATIONS: None immediate. TECHNIQUE: Emergent consent was obtained/presumed from as there is no family available. Specific risks include: Bleeding, infection, contrast reaction, kidney injury/failure, need for further procedure/surgery, arterial injury or dissection, embolization to new territory, intracranial hemorrhage (10-15% risk), neurologic deterioration, cardiopulmonary collapse, death. All questions were addressed. Maximal Sterile Barrier Technique was utilized including during the procedure including caps, mask, sterile gowns, sterile gloves, sterile drape, hand hygiene and skin antiseptic. A timeout was performed prior to the initiation of the procedure. FINDINGS: Initial: Significant tortuosity at the aortic arch with type 3 arch Right common carotid artery: Mild tortuosity of the common carotid artery without significant plaque. Right external carotid artery: Patent with antegrade flow. Right internal carotid artery: Normal course caliber and contour of the cervical portion. Vertical and petrous segment patent with normal course caliber contour. Cavernous segment patent. Clinoid segment patent. Antegrade flow of the ophthalmic artery. Ophthalmic segment patent. Terminus patent. Right MCA:  M1 segment patent.  Occlusion of superior division. Right ACA: A 1 segment patent. A 2 segment perfuses the right territory. Cross filling of the communicating artery with some flash filling of the left anterior cerebral territory. Final: Final angiogram demonstrates restoration of TICI3 flow of the MCA territory. PROCEDURE: Patient is brought to the interventional radiology  suite with the patient's identity confirmed. Patient positioned supine position on the fluoroscopy table. General anesthesia was induced by the anesthesia team. Patient is then prepped and draped in the usual sterile fashion. Ultrasound survey of the right inguinal region was performed with images stored and sent to PACs. 11 blade scalpel was used to make a small incision. A micropuncture needle was used access the right common femoral artery under ultrasound. With excellent arterial blood flow returned, an .018 micro wire was passed through the needle, observed to enter the abdominal aorta under fluoroscopy. The needle was removed, and a micropuncture sheath was placed over the wire. The inner dilator and wire were removed, and an 035 Bentson wire was advanced under fluoroscopy into the abdominal aorta. The sheath was removed and a standard 5 [place] vascular sheath was placed. The dilator was removed and the sheath was flushed. A 43F JB-1 diagnostic catheter was advanced over the wire to the proximal descending thoracic aorta. Wire was then removed. Double flush of the catheter was performed. Catheter was then used to select the innominate artery. Angiogram was performed. Using roadmap technique, the catheter was advanced over a roadrunner wire, immediately flipping into the aortic arch given the tortuosity. Roadrunner wire was removed and a Glidewire was used. Using the Glidewire, catheter was advanced into the proximal segment of the internal carotid artery. Attempt a passing the Rosen wire was initially  unsuccessful, as the diagnostic catheter withdrew into the proximal internal carotid artery. Roadrunner wire was then passed into the distal ICA, and the diagnostic catheter was then repositioned into the distal ICA. Rosen wire was then placed into the distal ICA. Formal angiogram was performed. The 5 French sheath was removed and exchanged for 8 French 55 centimeter BrightTip sheath. Sheath was flushed and  attached to pressurized and heparinized saline bag for constant forward flow. Then an 8 [place], 85 cm Flowgate balloon tip catheter was prepared on the back table with inflation of the balloon with 50/50 concentration of dilute contrast. The balloon catheter was then advanced over the wire, an using the coaxial Bern shaped catheter, positioned into the mid internal carotid artery. Copious back flush was performed and the balloon catheter was attached to heparinized and pressurized saline bag for forward flow. Angiogram was performed for road map guide. Microcatheter system was then introduced through the balloon guide catheter, using a synchro soft 014 wire and a Trevo Provue18 catheter. Microcatheter system was advanced into the internal carotid artery, to the level of the occlusion. The micro wire was then carefully advanced through the occluded M2 segment. Microcatheter would not pass over the wire through the occluded segment without significant tension on the system, which caused the balloon guide catheter to prolapse into the aortic arch. A was necessary to removed microcatheter and microwire, and reduce the system by removal of the balloon guide catheter. JB 1 diagnostic catheter and Glidewire were then used to select the innominate artery. JB 1 catheter was advanced into the internal carotid artery over the standard Glidewire. Again, the roadrunner wire was then placed into the distal internal carotid artery an the JB 1 catheter was advanced distally to the internal carotid artery. Rosen wire was then placed to the distal internal carotid artery. Eight [place] Merci balloon guide catheter was then advanced over the Rosen wire into the internal carotid artery, using coaxial straight catheter/introducer. Once the Merci balloon guide catheter was in place, roadmap angiogram was performed. Microcatheter and synchro soft wire were then used to navigate to the face of the occlusion. Microcatheter was then push  through the occluded segment and the wire was removed. Blood was then aspirated through the hub of the microcatheter, and a gentle contrast injection was performed confirming intraluminal position. A rotating hemostatic valve was then attached to the back end of the microcatheter, and a pressurized and heparinized saline bag was attached to the catheter. 4 x 40 solitaire device was then selected. Back flush was achieved at the rotating hemostatic valve, and then the device was gently advanced through the microcatheter to the distal end. The retriever was then unsheathed by withdrawing the microcatheter under fluoroscopy. Once the retriever was completely unsheathed, control angiogram was performed from the balloon catheter. The balloon at the balloon tip catheter was then inflated under fluoroscopy for proximal flow arrest. Constant aspiration was then performed at the tip of the balloon guide catheter as the retriever was gently and slowly withdrawn with fluoroscopic observation. Once the retriever was entirely removed from the system, free aspiration was confirmed at the hub of the balloon guide catheter, with free blood return confirmed. The balloon was then deflated, rotating hemostatic valve was reattached, and a control angiogram was performed. Segment remained occluded after the first pass. Again, the microcatheter and microwire were advanced to the face of the occlusion, and through the occlusion into the angular branch. Wire was removed. Blood was then aspirated through the  hub of the microcatheter, and a gentle contrast injection was performed confirming intraluminal position. A rotating hemostatic valve was then attached to the back end of the microcatheter, and a pressurized and heparinized saline bag was attached to the catheter. 4 x 40 solitaire device was then selected. Back flush was achieved at the rotating hemostatic valve, and then the device was gently advanced through the microcatheter to the  distal end. The retriever was then unsheathed by withdrawing the microcatheter under fluoroscopy. Once the retriever was completely unsheathed, control angiogram was performed from the balloon catheter. Segment remained occluded after the second pass. A cat 5 intermediate catheter was then loaded with the coaxial Trevo microcatheter. Combination the microcatheter and the intermediate catheter were then advanced to the proximal aspect of the clot. Wire was navigated into the angular branch with a microcatheter passed into the angular branch. Cat 5 catheter was advanced into the distal M1 segment. 4 x 40 solitaire device was then again selected and deployed. Mercy balloon catheter was inflated. After deployment, the cat 5 catheter was advanced under aspiration over the solitaire into the origin of the occluded segment. The entire system was then removed. Angiogram was performed, confirming restoration of TICI 3 flow. Control angiogram was performed at the right common femoral artery puncture site. The 55 centimeter 8 French sheath was removed over the roadrunner wire. Overlapping suture devices were used for closure of the right common femoral artery access. Patient tolerated the procedure well and remained hemodynamically stable throughout. Estimated blood loss from a scalp laceration is approximately 200 cc. IMPRESSION: Status post cerebral angiogram and successful mechanical thrombectomy of occluded right superior division M2 branch, with restoration of TICI 3 flow. Closure of right common femoral artery access with overlapping suture mediated devices. Signed, Yvone Neu. Loreta Ave DO [facility] Radiology PLAN: Patient extubated in IR. Transport to CT for post CT Overnight ICU admission Right leg straight overnight for hemostasis. Frequent neurovascular checks. Electronically Signed   By: Gilmer Mor D.O.   On: 26-Oct-2017 16:41     STUDIES:    CULTURES:   ANTIBIOTICS: none  SIGNIFICANT EVENTS: This is day 3 following hip fracture and day 1 following CVA  LINES/TUBES:   DISCUSSION: This is an 83 year old type II diabetic with chronic atrial fibrillation who suffered from a fractured hip on 11/ 2.  She developed left hemiparesis on 11/4, received TPA and subsequently had a MCA thrombectomy.  There was a substantial drop in hemoglobin following these events however her hemoglobin has been stable overnight.  ASSESSMENT / PLAN:  CARDIOVASCULAR A: He continues as needed antihypertensives to prevent a reperfusion insult the following the thrombectomy.  HEMATOLOGIC A: Hemoglobin is currently stable.  We will continue to monitor her vital signs and serial hemoglobins. She has a large potential space to bleed into her hip.  Scalp bleeding appears to have stopped.   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  There is midline shift and she has been ordered 3% saline this morning.  In addition there is blood in the ventricular system and we will need to monitor for the potential evolution of hydrocephalus.   Finally she does have a hip fracture.  He is at extremely high risk for DVT and I will order a screening Doppler on 11/6.   Rater than 32 minutes was spent in the care of this patient today not counting time performing procedures  Penny Pia, MD Critical Care Medicine [facility]  Pager: [phone]  10/25/2017, 8:30 AM

## 2017-10-25 NOTE — Consult Note (Signed)
Reason for Consult:Left hip fx Referring Physician: Dudley Major is an 83 y.o. female with afib on Xarelto, CVA, and DM. HPI: Raliyah fell on 11/2 and fractured her left hip. She was at home in her kitchen, was unsure of the circumstances of the fall but denied syncope. She hit her head and fell onto her left side. She was taken to [facility] where x-rays showed a left hip fracture. Her Xarelto was held but when she was taken to preop on 11/4 she was noted to have new LUE weakness. Code Stroke was called and she was noted to have an evolving CVA. She was given thrombolytic therapy and was transferred to [facility] for further care. Her hgb dropped substantially and she received transfusions and her TPA was reveresed.  Her case is complicated by the fact that her recovery is unknown at this point. Neurology is pessimistic about significant improvement in her left hemiparesis. Even more complicating is the fact that she has no family or other medical decision maker nor did she leave any living will or advanced directive. She is unable to communicate at this point but will follow commands on her right side. An ethics consult has been requested and is pending.  History reviewed. No pertinent past medical history.  Past Surgical History:  Procedure Laterality Date  . IR PERCUTANEOUS ART THROMBECTOMY/INFUSION INTRACRANIAL INC DIAG ANGIO  11/01/2017  . IR US GUIDE VASC ACCESS RIGHT  10/21/2017    History reviewed. No pertinent family history.  Social History:  has an unknown smoking status. she has never used smokeless tobacco. Her alcohol and drug histories are not on file.  Allergies:  Allergies  Allergen Reactions  . Penicillins Rash    Medications: I have reviewed the patient's current medications.  Results for orders placed or performed during the hospital encounter of 11/04/2017 (from the past 48 hour(s))  I-STAT 7, (LYTES, BLD GAS, ICA, H+H)     Status: Abnormal   Collection  Time: 10/23/2017  3:04 PM  Result Value Ref Range   pH, Arterial 7.374 7.350 - 7.450   pCO2 arterial 34.8 32.0 - 48.0 mmHg   pO2, Arterial 275.0 (H) 83.0 - 108.0 mmHg   Bicarbonate 21.0 20.0 - 28.0 mmol/L   TCO2 22 22 - 32 mmol/L   O2 Saturation 100.0 %   Acid-base deficit 5.0 (H) 0.0 - 2.0 mmol/L   Sodium 135 135 - 145 mmol/L   Potassium 3.1 (L) 3.5 - 5.1 mmol/L   Calcium, Ion 1.10 (L) 1.15 - 1.40 mmol/L   HCT 19.0 (L) 36.0 - 46.0 %   Hemoglobin 6.5 (LL) 12.0 - 15.0 g/dL   Patient temperature 34.0 C    Sample type ARTERIAL   Prepare RBC     Status: None   Collection Time: 11/01/2017  3:25 PM  Result Value Ref Range   Order Confirmation ORDER PROCESSED BY BLOOD BANK   Type and screen     Status: None   Collection Time: 10/25/2017  3:26 PM  Result Value Ref Range   ABO/RH(D) B POS    Antibody Screen NEG    Sample Expiration 10/27/2017    Unit Number [id number]    Blood Component Type RED CELLS,LR    Unit division 00    Status of Unit ISSUED,FINAL    Transfusion Status OK TO TRANSFUSE    Crossmatch Result Compatible    Unit Number [id number]    Blood Component Type RED CELLS,LR  Unit division 00    Status of Unit ISSUED,FINAL    Transfusion Status OK TO TRANSFUSE    Crossmatch Result Compatible   ABO/Rh     Status: None   Collection Time: 11/08/2017  3:26 PM  Result Value Ref Range   ABO/RH(D) B POS   Glucose, capillary     Status: Abnormal   Collection Time: 10/22/2017  4:43 PM  Result Value Ref Range   Glucose-Capillary 117 (H) 65 - 99 mg/dL  Prepare cryoprecipitate     Status: None   Collection Time: 11/01/2017  4:46 PM  Result Value Ref Range   Unit Number [id number]    Blood Component Type CRYPOOL THAW    Unit division 00    Status of Unit ISSUED,FINAL    Transfusion Status OK TO TRANSFUSE    Unit Number [id number]    Blood Component Type CRYPOOL THAW    Unit division 00    Status of Unit ISSUED,FINAL    Transfusion Status OK TO TRANSFUSE   Fibrinogen      Status: None   Collection Time: 11/14/2017  4:57 PM  Result Value Ref Range   Fibrinogen 240 210 - 475 mg/dL  CBC     Status: Abnormal   Collection Time: 10/25/2017  9:02 PM  Result Value Ref Range   WBC 7.6 4.0 - 10.5 K/uL   RBC 2.69 (L) 3.87 - 5.11 MIL/uL   Hemoglobin 8.1 (L) 12.0 - 15.0 g/dL   HCT 23.9 (L) 36.0 - 46.0 %   MCV 88.8 78.0 - 100.0 fL   MCH 30.1 26.0 - 34.0 pg   MCHC 33.9 30.0 - 36.0 g/dL   RDW 13.6 11.5 - 15.5 %   Platelets 90 (L) 150 - 400 K/uL    Comment: PLATELET COUNT CONFIRMED BY SMEAR  Protime-INR     Status: Abnormal   Collection Time: 11/16/2017  9:02 PM  Result Value Ref Range   Prothrombin Time 16.9 (H) 11.4 - 15.2 seconds   INR 0.34   Basic metabolic panel     Status: Abnormal   Collection Time: 10/21/2017  9:02 PM  Result Value Ref Range   Sodium 132 (L) 135 - 145 mmol/L   Potassium 3.6 3.5 - 5.1 mmol/L   Chloride 106 101 - 111 mmol/L   CO2 19 (L) 22 - 32 mmol/L   Glucose, Bld 144 (H) 65 - 99 mg/dL   BUN 13 6 - 20 mg/dL   Creatinine, Ser 0.71 0.44 - 1.00 mg/dL   Calcium 7.6 (L) 8.9 - 10.3 mg/dL   GFR calc non Af Amer >60 >60 mL/min   GFR calc Af Amer >60 >60 mL/min    Comment: (NOTE) The eGFR has been calculated using the CKD EPI equation. This calculation has not been validated in all clinical situations. eGFR's persistently <60 mL/min signify possible Chronic Kidney Disease.    Anion gap 7 5 - 15  Magnesium     Status: Abnormal   Collection Time: 10/23/2017  9:02 PM  Result Value Ref Range   Magnesium 1.3 (L) 1.7 - 2.4 mg/dL  Phosphorus     Status: None   Collection Time: 11/18/2017  9:02 PM  Result Value Ref Range   Phosphorus 3.2 2.5 - 4.6 mg/dL  I-STAT 3, arterial blood gas (G3+)     Status: Abnormal   Collection Time: 11/11/2017  9:17 PM  Result Value Ref Range   pH, Arterial 7.366 7.350 - 7.450   pCO2 arterial 34.9  32.0 - 48.0 mmHg   pO2, Arterial 185.0 (H) 83.0 - 108.0 mmHg   Bicarbonate 20.1 20.0 - 28.0 mmol/L   TCO2 21 (L) 22 - 32  mmol/L   O2 Saturation 100.0 %   Acid-base deficit 5.0 (H) 0.0 - 2.0 mmol/L   Patient temperature 97.9 F    Collection site RADIAL, ALLEN'S TEST ACCEPTABLE    Drawn by Operator    Sample type ARTERIAL   Glucose, capillary     Status: Abnormal   Collection Time: 11/12/2017 10:16 PM  Result Value Ref Range   Glucose-Capillary 118 (H) 65 - 99 mg/dL  CBC with Differential/Platelet     Status: Abnormal   Collection Time: 11/15/2017 11:22 PM  Result Value Ref Range   WBC 6.7 4.0 - 10.5 K/uL   RBC 2.94 (L) 3.87 - 5.11 MIL/uL   Hemoglobin 9.0 (L) 12.0 - 15.0 g/dL   HCT 25.7 (L) 36.0 - 46.0 %   MCV 87.4 78.0 - 100.0 fL   MCH 30.6 26.0 - 34.0 pg   MCHC 35.0 30.0 - 36.0 g/dL   RDW 13.7 11.5 - 15.5 %   Platelets 78 (L) 150 - 400 K/uL    Comment: CONSISTENT WITH PREVIOUS RESULT   Neutrophils Relative % 82 %   Neutro Abs 5.5 1.7 - 7.7 K/uL   Lymphocytes Relative 8 %   Lymphs Abs 0.5 (L) 0.7 - 4.0 K/uL   Monocytes Relative 10 %   Monocytes Absolute 0.7 0.1 - 1.0 K/uL   Eosinophils Relative 0 %   Eosinophils Absolute 0.0 0.0 - 0.7 K/uL   Basophils Relative 0 %   Basophils Absolute 0.0 0.0 - 0.1 K/uL  Sodium     Status: Abnormal   Collection Time: 10/25/17  1:36 AM  Result Value Ref Range   Sodium 133 (L) 135 - 145 mmol/L  Lipid panel     Status: None   Collection Time: 10/25/17  5:10 AM  Result Value Ref Range   Cholesterol 108 0 - 200 mg/dL   Triglycerides 79 <150 mg/dL   HDL 41 >40 mg/dL   Total CHOL/HDL Ratio 2.6 RATIO   VLDL 16 0 - 40 mg/dL   LDL Cholesterol 51 0 - 99 mg/dL    Comment:        Total Cholesterol/HDL:CHD Risk Coronary Heart Disease Risk Table                     Men   Women  1/2 Average Risk   3.4   3.3  Average Risk       5.0   4.4  2 X Average Risk   9.6   7.1  3 X Average Risk  23.4   11.0        Use the calculated Patient Ratio above and the CHD Risk Table to determine the patient's CHD Risk.        ATP III CLASSIFICATION (LDL):  <100     mg/dL    Optimal  100-129  mg/dL   Near or Above                    Optimal  130-159  mg/dL   Borderline  160-189  mg/dL   High  >190     mg/dL   Very High   Hemoglobin A1c     Status: Abnormal   Collection Time: 10/25/17  5:10 AM  Result Value Ref Range   Hgb A1c MFr  Bld 5.8 (H) 4.8 - 5.6 %    Comment: (NOTE) Pre diabetes:          5.7%-6.4% Diabetes:              >6.4% Glycemic control for   <7.0% adults with diabetes    Mean Plasma Glucose 119.76 mg/dL  CBC with Differential/Platelet     Status: Abnormal   Collection Time: 10/25/17  5:10 AM  Result Value Ref Range   WBC 6.9 4.0 - 10.5 K/uL   RBC 2.88 (L) 3.87 - 5.11 MIL/uL   Hemoglobin 8.6 (L) 12.0 - 15.0 g/dL   HCT 24.9 (L) 36.0 - 46.0 %   MCV 86.5 78.0 - 100.0 fL   MCH 29.9 26.0 - 34.0 pg   MCHC 34.5 30.0 - 36.0 g/dL   RDW 14.2 11.5 - 15.5 %   Platelets 85 (L) 150 - 400 K/uL    Comment: CONSISTENT WITH PREVIOUS RESULT   Neutrophils Relative % 78 %   Neutro Abs 5.4 1.7 - 7.7 K/uL   Lymphocytes Relative 11 %   Lymphs Abs 0.7 0.7 - 4.0 K/uL   Monocytes Relative 11 %   Monocytes Absolute 0.7 0.1 - 1.0 K/uL   Eosinophils Relative 0 %   Eosinophils Absolute 0.0 0.0 - 0.7 K/uL   Basophils Relative 0 %   Basophils Absolute 0.0 0.0 - 0.1 K/uL  Basic metabolic panel     Status: Abnormal   Collection Time: 10/25/17  5:10 AM  Result Value Ref Range   Sodium 136 135 - 145 mmol/L   Potassium 4.1 3.5 - 5.1 mmol/L   Chloride 108 101 - 111 mmol/L   CO2 18 (L) 22 - 32 mmol/L   Glucose, Bld 124 (H) 65 - 99 mg/dL   BUN 13 6 - 20 mg/dL   Creatinine, Ser 0.71 0.44 - 1.00 mg/dL   Calcium 7.8 (L) 8.9 - 10.3 mg/dL   GFR calc non Af Amer >60 >60 mL/min   GFR calc Af Amer >60 >60 mL/min    Comment: (NOTE) The eGFR has been calculated using the CKD EPI equation. This calculation has not been validated in all clinical situations. eGFR's persistently <60 mL/min signify possible Chronic Kidney Disease.    Anion gap 10 5 - 15  Magnesium      Status: Abnormal   Collection Time: 10/25/17  5:10 AM  Result Value Ref Range   Magnesium 2.5 (H) 1.7 - 2.4 mg/dL  Phosphorus     Status: None   Collection Time: 10/25/17  5:10 AM  Result Value Ref Range   Phosphorus 2.7 2.5 - 4.6 mg/dL  Glucose, capillary     Status: Abnormal   Collection Time: 10/25/17  7:25 AM  Result Value Ref Range   Glucose-Capillary 133 (H) 65 - 99 mg/dL   Comment 1 Notify RN    Comment 2 Document in Chart   Sodium     Status: None   Collection Time: 10/25/17  7:58 AM  Result Value Ref Range   Sodium 136 135 - 145 mmol/L    Ct Head Wo Contrast  Result Date: 10/25/2017 CLINICAL DATA:  Altered level of consciousness. History of RIGHT ICA occlusion, mechanical thrombectomy of the M2 occlusion. EXAM: CT HEAD WITHOUT CONTRAST TECHNIQUE: Contiguous axial images were obtained from the base of the skull through the vertex without intravenous contrast. COMPARISON:  Multiple CT October 24, 2017 back or FINDINGS: BRAIN: Evolving RIGHT frontal lobe intraparenchymal hematoma with marginal a  hazy density. Smaller RIGHT basal ganglia hemorrhage, less conspicuous than prior CT. Small similar RIGHT occipital lobe hemorrhage. Smaller RIGHT temporal lobe indistinct hemorrhage. Subcentimeter LEFT insular hemorrhage versus subarachnoid blood products. Increased intraventricular blood products. 5 mm RIGHT to LEFT midline shift, decreased from 7 mm. Mild similar LEFT ventricular entrapment. Subarachnoid hemorrhage RIGHT basal cistern. Mild RIGHT uncal herniation. VASCULAR: Mild calcific atherosclerosis of the carotid siphons. SKULL: No skull fracture. Moderate LEFT parietoccipital scalp hematoma. SINUSES/ORBITS: Severe chronic pan paranasal sinusitis. Mastoid air cells are well aerated.The included ocular globes and orbital contents are non-suspicious. OTHER: None. IMPRESSION: 1. Multifocal intraparenchymal hemorrhage, some of which appear smaller most compatible with a component of resolving  contrast staining. 2. Increasing intraventricular blood products with mild LEFT ventricle entrapment. 5 mm RIGHT to LEFT midline shift, decreased from 7 mm. 3. Small volume scattered subarachnoid hemorrhage. 4. Moderate LEFT parietoccipital scalp hematoma. Electronically Signed   By: Elon Alas M.D.   On: 10/25/2017 02:03   Ct Head Wo Contrast  Result Date: 10/22/2017 CLINICAL DATA:  Status post revascularization of the right middle cerebral artery. EXAM: CT HEAD WITHOUT CONTRAST TECHNIQUE: Contiguous axial images were obtained from the base of the skull through the vertex without intravenous contrast. COMPARISON:  CT head without contrast from the same day at 11:50 a.m. FINDINGS: Brain: There significant increase in size and number of multiple foci of parenchymal hemorrhage. The largest is in the right frontal lobe, measuring 3.6 x 3.8 x 6.0 cm. There is hemorrhage into the right basal ganglia and anterior limb internal capsule. Right occipital lobe hemorrhages noted. Hemorrhage in the medial right temporal lobe measures 3.4 x 2.5 x 4.8 cm. Diffuse subarachnoid hemorrhage is present involving the suprasellar cisterns and along the tentorium. Minimal blood is noted within the ventricles, posteriorly on the left. There is some dilation of the left temporal tip suggesting early hydrocephalus. The para mesencephalic cisterns are opacified. Right-to-left midline shift measures 6.5 mm. Vascular: Atherosclerotic calcifications are present within the cavernous internal carotid artery is bilaterally. There is no residual hyperdense vessel. Skull: Calvarium is intact. Sinuses/Orbits: Chronic sinus disease is again seen. Bilateral lens replacements are noted. IMPRESSION: 1. Multiple sites of parenchymal hemorrhage involving the right frontal lobe, right basal ganglia, and right temporal lobe. 2. Progressive hemorrhage in the right occipital lobe. 3. Extensive effacement of the sulci over the right hemispheric and 6  mm of right-to-left midline shift. 4. Diffuse subarachnoid hemorrhage is now present. 5. Intraventricular hemorrhages noted with early dilation of the left temporal tip. Critical Value/emergent results were called by telephone at the time of interpretation on 11/15/2017 at 4:36 pm to Dr. Corrie Mckusick , who verbally acknowledged these results. Electronically Signed   By: San Morelle M.D.   On: 11/09/2017 16:38   Ct Head Wo Contrast  Result Date: 11/08/2017 CLINICAL DATA:  83 year old female transferred from [facility] for emergent large vessel occlusion, right ICA terminus and right MCA. Now status post IV tPA, and transfer to [place]: CT HEAD WITHOUT CONTRAST TECHNIQUE: Contiguous axial images were obtained from the base of the skull through the vertex without intravenous contrast. COMPARISON:  CTA head and neck and noncontrast head [facility] 0903 hours today. FINDINGS: Brain: Along the medial aspect of the right lentiform hypodensity seen earlier today there is now crescent-shaped and other scattered abnormal hyperdensity versus enhancement. There is more curvilinear abnormal hyperdensity or enhancement in the left inferior frontal gyrus (series 3, image 16). There is more  confluent hyperdensity or enhancement in the mesial right temporal lobe (image 14) and also new curvilinear hyperdensity also at the right occipital pole (same image). There is also trace curvilinear hyperdensity along the more superior right frontal convexity (image 22). No associated mass effect. No intraventricular hemorrhage. No other extra-axial hemorrhage. No new areas of cytotoxic edema identified. Left hemisphere and posterior fossa gray-white matter differentiation is stable. Vascular: Residual intravascular contrast is present. Calcified atherosclerosis at the skull base. Skull: No acute osseous abnormality identified. Sinuses/Orbits: Stable chronic sinusitis findings. Tympanic cavities  and mastoids remain clear. Other: The patient has a broad-based left posterior convexity scalp hematoma measuring up to 7 mm in thickness which is new or increased from the CTA head earlier today (series 4, image 35). The underlying calvarium is intact. Other scalp soft tissues appear stable and within normal limits. No acute orbit soft tissue findings. IMPRESSION: 1. Small new multifocal areas of abnormal hyperdensity in the right hemisphere. Although some of these in the right MCA territory might reflect post ischemic contrast staining, there is at least a small volume of acute hemorrhage, including that seen at the right occipital pole which is probably in the subarachnoid space. This was discussed in person with Dr. Amie Portland on 11/15/2017 at 1200 hours. 2. No intracranial mass effect. No new areas of cytotoxic edema in the right MCA territory. 3. New or increased left posterior scalp hematoma since 0902 hours today, 7 mm in thickness. No underlying skull fracture. Electronically Signed   By: Genevie Ann M.D.   On: 10/26/2017 12:44   Ir US Guide Vasc Access Right  Result Date: 11/14/2017 INDICATION: 83 year old female with a history of acute right MCA syndrome, normal baseline, stroke exam of 12, CT imaging showing large vessel occlusion. She has been transferred after a dose of IV tPA, formed attempt at mechanical thrombectomy. CT performed to evaluate for ASPECTS decay shows stable aspects score, however, hemorrhagic transformation in several small peripheral regions and basal ganglia. After a thorough discussion of risks and benefits with the Stroke Neurology team, it is felt that attempting mechanical thrombectomy will give the patient the best chance of recovery. EXAM: ULTRASOUND GUIDED ACCESS RIGHT COMMON FEMORAL ARTERY CEREBRAL ANGIOGRAM MECHANICAL THROMBECTOMY RIGHT M2 BRANCH SUTURE MEDIATED CLOSURE OF RIGHT COMMON FEMORAL ARTERY ACCESS COMPARISON:  CT imaging same day MEDICATIONS: Vancomycin 1 gm IV.  The antibiotic was administered within 1 hour of the procedure ANESTHESIA/SEDATION: General anesthesia CONTRAST:  148 cc Isovue FLUOROSCOPY TIME:  Fluoroscopy Time: 56 minutes 48 seconds (1,910 mGy). COMPLICATIONS: None immediate. TECHNIQUE: Emergent consent was obtained/presumed from as there is no family available. Specific risks include: Bleeding, infection, contrast reaction, kidney injury/failure, need for further procedure/surgery, arterial injury or dissection, embolization to new territory, intracranial hemorrhage (10-15% risk), neurologic deterioration, cardiopulmonary collapse, death. All questions were addressed. Maximal Sterile Barrier Technique was utilized including during the procedure including caps, mask, sterile gowns, sterile gloves, sterile drape, hand hygiene and skin antiseptic. A timeout was performed prior to the initiation of the procedure. FINDINGS: Initial: Significant tortuosity at the aortic arch with type 3 arch Right common carotid artery: Mild tortuosity of the common carotid artery without significant plaque. Right external carotid artery: Patent with antegrade flow. Right internal carotid artery: Normal course caliber and contour of the cervical portion. Vertical and petrous segment patent with normal course caliber contour. Cavernous segment patent. Clinoid segment patent. Antegrade flow of the ophthalmic artery. Ophthalmic segment patent. Terminus patent. Right MCA:  M1 segment patent.  Occlusion of superior division. Right ACA: A 1 segment patent. A 2 segment perfuses the right territory. Cross filling of the communicating artery with some flash filling of the left anterior cerebral territory. Final: Final angiogram demonstrates restoration of TICI3 flow of the MCA territory. PROCEDURE: Patient is brought to the interventional radiology suite with the patient's identity confirmed. Patient positioned supine position on the fluoroscopy table. General anesthesia was induced by the  anesthesia team. Patient is then prepped and draped in the usual sterile fashion. Ultrasound survey of the right inguinal region was performed with images stored and sent to PACs. 11 blade scalpel was used to make a small incision. A micropuncture needle was used access the right common femoral artery under ultrasound. With excellent arterial blood flow returned, an .018 micro wire was passed through the needle, observed to enter the abdominal aorta under fluoroscopy. The needle was removed, and a micropuncture sheath was placed over the wire. The inner dilator and wire were removed, and an 035 Bentson wire was advanced under fluoroscopy into the abdominal aorta. The sheath was removed and a standard 5 [place] vascular sheath was placed. The dilator was removed and the sheath was flushed. A 39F JB-1 diagnostic catheter was advanced over the wire to the proximal descending thoracic aorta. Wire was then removed. Double flush of the catheter was performed. Catheter was then used to select the innominate artery. Angiogram was performed. Using roadmap technique, the catheter was advanced over a roadrunner wire, immediately flipping into the aortic arch given the tortuosity. Roadrunner wire was removed and a Glidewire was used. Using the Glidewire, catheter was advanced into the proximal segment of the internal carotid artery. Attempt a passing the Rosen wire was initially unsuccessful, as the diagnostic catheter withdrew into the proximal internal carotid artery. Roadrunner wire was then passed into the distal ICA, and the diagnostic catheter was then repositioned into the distal ICA. Rosen wire was then placed into the distal ICA. Formal angiogram was performed. The 5 French sheath was removed and exchanged for 8 French 55 centimeter BrightTip sheath. Sheath was flushed and attached to pressurized and heparinized saline bag for constant forward flow. Then an 8 [place], 85 cm Flowgate balloon tip catheter was prepared on  the back table with inflation of the balloon with 50/50 concentration of dilute contrast. The balloon catheter was then advanced over the wire, an using the coaxial Bern shaped catheter, positioned into the mid internal carotid artery. Copious back flush was performed and the balloon catheter was attached to heparinized and pressurized saline bag for forward flow. Angiogram was performed for road map guide. Microcatheter system was then introduced through the balloon guide catheter, using a synchro soft 014 wire and a Trevo Provue18 catheter. Microcatheter system was advanced into the internal carotid artery, to the level of the occlusion. The micro wire was then carefully advanced through the occluded M2 segment. Microcatheter would not pass over the wire through the occluded segment without significant tension on the system, which caused the balloon guide catheter to prolapse into the aortic arch. A was necessary to removed microcatheter and microwire, and reduce the system by removal of the balloon guide catheter. JB 1 diagnostic catheter and Glidewire were then used to select the innominate artery. JB 1 catheter was advanced into the internal carotid artery over the standard Glidewire. Again, the roadrunner wire was then placed into the distal internal carotid artery an the JB 1 catheter was advanced distally to the internal carotid artery. Rosen wire  was then placed to the distal internal carotid artery. Eight [place] Merci balloon guide catheter was then advanced over the Rosen wire into the internal carotid artery, using coaxial straight catheter/introducer. Once the Merci balloon guide catheter was in place, roadmap angiogram was performed. Microcatheter and synchro soft wire were then used to navigate to the face of the occlusion. Microcatheter was then push through the occluded segment and the wire was removed. Blood was then aspirated through the hub of the microcatheter, and a gentle contrast injection  was performed confirming intraluminal position. A rotating hemostatic valve was then attached to the back end of the microcatheter, and a pressurized and heparinized saline bag was attached to the catheter. 4 x 40 solitaire device was then selected. Back flush was achieved at the rotating hemostatic valve, and then the device was gently advanced through the microcatheter to the distal end. The retriever was then unsheathed by withdrawing the microcatheter under fluoroscopy. Once the retriever was completely unsheathed, control angiogram was performed from the balloon catheter. The balloon at the balloon tip catheter was then inflated under fluoroscopy for proximal flow arrest. Constant aspiration was then performed at the tip of the balloon guide catheter as the retriever was gently and slowly withdrawn with fluoroscopic observation. Once the retriever was entirely removed from the system, free aspiration was confirmed at the hub of the balloon guide catheter, with free blood return confirmed. The balloon was then deflated, rotating hemostatic valve was reattached, and a control angiogram was performed. Segment remained occluded after the first pass. Again, the microcatheter and microwire were advanced to the face of the occlusion, and through the occlusion into the angular branch. Wire was removed. Blood was then aspirated through the hub of the microcatheter, and a gentle contrast injection was performed confirming intraluminal position. A rotating hemostatic valve was then attached to the back end of the microcatheter, and a pressurized and heparinized saline bag was attached to the catheter. 4 x 40 solitaire device was then selected. Back flush was achieved at the rotating hemostatic valve, and then the device was gently advanced through the microcatheter to the distal end. The retriever was then unsheathed by withdrawing the microcatheter under fluoroscopy. Once the retriever was completely unsheathed, control  angiogram was performed from the balloon catheter. Segment remained occluded after the second pass. A cat 5 intermediate catheter was then loaded with the coaxial Trevo microcatheter. Combination the microcatheter and the intermediate catheter were then advanced to the proximal aspect of the clot. Wire was navigated into the angular branch with a microcatheter passed into the angular branch. Cat 5 catheter was advanced into the distal M1 segment. 4 x 40 solitaire device was then again selected and deployed. Mercy balloon catheter was inflated. After deployment, the cat 5 catheter was advanced under aspiration over the solitaire into the origin of the occluded segment. The entire system was then removed. Angiogram was performed, confirming restoration of TICI 3 flow. Control angiogram was performed at the right common femoral artery puncture site. The 55 centimeter 8 French sheath was removed over the roadrunner wire. Overlapping suture devices were used for closure of the right common femoral artery access. Patient tolerated the procedure well and remained hemodynamically stable throughout. Estimated blood loss from a scalp laceration is approximately 200 cc. IMPRESSION: Status post cerebral angiogram and successful mechanical thrombectomy of occluded right superior division M2 branch, with restoration of TICI 3 flow. Closure of right common femoral artery access with overlapping suture mediated devices. Signed, York Cerise  Pasty Arch, DO [facility] Radiology PLAN: Patient extubated in IR. Transport to CT for post CT Overnight ICU admission Right leg straight overnight for hemostasis. Frequent neurovascular checks. Electronically Signed   By: Corrie Mckusick D.O.   On: 11/08/2017 16:41   Dg Chest Port 1 View  Result Date: 10/25/2017 CLINICAL DATA:  Abnormal chest x-ray EXAM: PORTABLE CHEST 1 VIEW COMPARISON:  06/08/2017 FINDINGS: Mild to moderate cardiomegaly. Aortic  atherosclerosis. No consolidation or effusion. No pneumothorax. IMPRESSION: Cardiomegaly without edema or infiltrate. Electronically Signed   By: Donavan Foil M.D.   On: 10/25/2017 03:45   Ir Percutaneous Art Thrombectomy/infusion Intracranial Inc Diag Angio  Result Date: 11/19/2017 INDICATION: 83 year old female with a history of acute right MCA syndrome, normal baseline, stroke exam of 12, CT imaging showing large vessel occlusion. She has been transferred after a dose of IV tPA, formed attempt at mechanical thrombectomy. CT performed to evaluate for ASPECTS decay shows stable aspects score, however, hemorrhagic transformation in several small peripheral regions and basal ganglia. After a thorough discussion of risks and benefits with the Stroke Neurology team, it is felt that attempting mechanical thrombectomy will give the patient the best chance of recovery. EXAM: ULTRASOUND GUIDED ACCESS RIGHT COMMON FEMORAL ARTERY CEREBRAL ANGIOGRAM MECHANICAL THROMBECTOMY RIGHT M2 BRANCH SUTURE MEDIATED CLOSURE OF RIGHT COMMON FEMORAL ARTERY ACCESS COMPARISON:  CT imaging same day MEDICATIONS: Vancomycin 1 gm IV. The antibiotic was administered within 1 hour of the procedure ANESTHESIA/SEDATION: General anesthesia CONTRAST:  148 cc Isovue FLUOROSCOPY TIME:  Fluoroscopy Time: 56 minutes 48 seconds (1,910 mGy). COMPLICATIONS: None immediate. TECHNIQUE: Emergent consent was obtained/presumed from as there is no family available. Specific risks include: Bleeding, infection, contrast reaction, kidney injury/failure, need for further procedure/surgery, arterial injury or dissection, embolization to new territory, intracranial hemorrhage (10-15% risk), neurologic deterioration, cardiopulmonary collapse, death. All questions were addressed. Maximal Sterile Barrier Technique was utilized including during the procedure including caps, mask, sterile gowns, sterile gloves, sterile drape, hand hygiene and skin antiseptic. A timeout  was performed prior to the initiation of the procedure. FINDINGS: Initial: Significant tortuosity at the aortic arch with type 3 arch Right common carotid artery: Mild tortuosity of the common carotid artery without significant plaque. Right external carotid artery: Patent with antegrade flow. Right internal carotid artery: Normal course caliber and contour of the cervical portion. Vertical and petrous segment patent with normal course caliber contour. Cavernous segment patent. Clinoid segment patent. Antegrade flow of the ophthalmic artery. Ophthalmic segment patent. Terminus patent. Right MCA:  M1 segment patent.  Occlusion of superior division. Right ACA: A 1 segment patent. A 2 segment perfuses the right territory. Cross filling of the communicating artery with some flash filling of the left anterior cerebral territory. Final: Final angiogram demonstrates restoration of TICI3 flow of the MCA territory. PROCEDURE: Patient is brought to the interventional radiology suite with the patient's identity confirmed. Patient positioned supine position on the fluoroscopy table. General anesthesia was induced by the anesthesia team. Patient is then prepped and draped in the usual sterile fashion. Ultrasound survey of the right inguinal region was performed with images stored and sent to PACs. 11 blade scalpel was used to make a small incision. A micropuncture needle was used access the right common femoral artery under ultrasound. With excellent arterial blood flow returned, an .018 micro wire was passed through the needle, observed to enter the abdominal aorta under fluoroscopy. The needle was removed, and a micropuncture sheath was placed over the wire.  The inner dilator and wire were removed, and an 035 Bentson wire was advanced under fluoroscopy into the abdominal aorta. The sheath was removed and a standard 5 [place] vascular sheath was placed. The dilator was removed and the sheath was flushed. A 46F JB-1 diagnostic  catheter was advanced over the wire to the proximal descending thoracic aorta. Wire was then removed. Double flush of the catheter was performed. Catheter was then used to select the innominate artery. Angiogram was performed. Using roadmap technique, the catheter was advanced over a roadrunner wire, immediately flipping into the aortic arch given the tortuosity. Roadrunner wire was removed and a Glidewire was used. Using the Glidewire, catheter was advanced into the proximal segment of the internal carotid artery. Attempt a passing the Rosen wire was initially unsuccessful, as the diagnostic catheter withdrew into the proximal internal carotid artery. Roadrunner wire was then passed into the distal ICA, and the diagnostic catheter was then repositioned into the distal ICA. Rosen wire was then placed into the distal ICA. Formal angiogram was performed. The 5 French sheath was removed and exchanged for 8 French 55 centimeter BrightTip sheath. Sheath was flushed and attached to pressurized and heparinized saline bag for constant forward flow. Then an 8 [place], 85 cm Flowgate balloon tip catheter was prepared on the back table with inflation of the balloon with 50/50 concentration of dilute contrast. The balloon catheter was then advanced over the wire, an using the coaxial Bern shaped catheter, positioned into the mid internal carotid artery. Copious back flush was performed and the balloon catheter was attached to heparinized and pressurized saline bag for forward flow. Angiogram was performed for road map guide. Microcatheter system was then introduced through the balloon guide catheter, using a synchro soft 014 wire and a Trevo Provue18 catheter. Microcatheter system was advanced into the internal carotid artery, to the level of the occlusion. The micro wire was then carefully advanced through the occluded M2 segment. Microcatheter would not pass over the wire through the occluded segment without significant  tension on the system, which caused the balloon guide catheter to prolapse into the aortic arch. A was necessary to removed microcatheter and microwire, and reduce the system by removal of the balloon guide catheter. JB 1 diagnostic catheter and Glidewire were then used to select the innominate artery. JB 1 catheter was advanced into the internal carotid artery over the standard Glidewire. Again, the roadrunner wire was then placed into the distal internal carotid artery an the JB 1 catheter was advanced distally to the internal carotid artery. Rosen wire was then placed to the distal internal carotid artery. Eight [place] Merci balloon guide catheter was then advanced over the Rosen wire into the internal carotid artery, using coaxial straight catheter/introducer. Once the Merci balloon guide catheter was in place, roadmap angiogram was performed. Microcatheter and synchro soft wire were then used to navigate to the face of the occlusion. Microcatheter was then push through the occluded segment and the wire was removed. Blood was then aspirated through the hub of the microcatheter, and a gentle contrast injection was performed confirming intraluminal position. A rotating hemostatic valve was then attached to the back end of the microcatheter, and a pressurized and heparinized saline bag was attached to the catheter. 4 x 40 solitaire device was then selected. Back flush was achieved at the rotating hemostatic valve, and then the device was gently advanced through the microcatheter to the distal end. The retriever was then unsheathed by withdrawing the microcatheter under fluoroscopy.  Once the retriever was completely unsheathed, control angiogram was performed from the balloon catheter. The balloon at the balloon tip catheter was then inflated under fluoroscopy for proximal flow arrest. Constant aspiration was then performed at the tip of the balloon guide catheter as the retriever was gently and slowly withdrawn  with fluoroscopic observation. Once the retriever was entirely removed from the system, free aspiration was confirmed at the hub of the balloon guide catheter, with free blood return confirmed. The balloon was then deflated, rotating hemostatic valve was reattached, and a control angiogram was performed. Segment remained occluded after the first pass. Again, the microcatheter and microwire were advanced to the face of the occlusion, and through the occlusion into the angular branch. Wire was removed. Blood was then aspirated through the hub of the microcatheter, and a gentle contrast injection was performed confirming intraluminal position. A rotating hemostatic valve was then attached to the back end of the microcatheter, and a pressurized and heparinized saline bag was attached to the catheter. 4 x 40 solitaire device was then selected. Back flush was achieved at the rotating hemostatic valve, and then the device was gently advanced through the microcatheter to the distal end. The retriever was then unsheathed by withdrawing the microcatheter under fluoroscopy. Once the retriever was completely unsheathed, control angiogram was performed from the balloon catheter. Segment remained occluded after the second pass. A cat 5 intermediate catheter was then loaded with the coaxial Trevo microcatheter. Combination the microcatheter and the intermediate catheter were then advanced to the proximal aspect of the clot. Wire was navigated into the angular branch with a microcatheter passed into the angular branch. Cat 5 catheter was advanced into the distal M1 segment. 4 x 40 solitaire device was then again selected and deployed. Mercy balloon catheter was inflated. After deployment, the cat 5 catheter was advanced under aspiration over the solitaire into the origin of the occluded segment. The entire system was then removed. Angiogram was performed, confirming restoration of TICI 3 flow. Control angiogram was performed at the  right common femoral artery puncture site. The 55 centimeter 8 French sheath was removed over the roadrunner wire. Overlapping suture devices were used for closure of the right common femoral artery access. Patient tolerated the procedure well and remained hemodynamically stable throughout. Estimated blood loss from a scalp laceration is approximately 200 cc. IMPRESSION: Status post cerebral angiogram and successful mechanical thrombectomy of occluded right superior division M2 branch, with restoration of TICI 3 flow. Closure of right common femoral artery access with overlapping suture mediated devices. Signed, Dulcy Fanny. Earleen Newport DO [facility] Radiology PLAN: Patient extubated in IR. Transport to CT for post CT Overnight ICU admission Right leg straight overnight for hemostasis. Frequent neurovascular checks. Electronically Signed   By: Corrie Mckusick D.O.   On: 11/04/2017 16:41    Review of Systems  Unable to perform ROS: Patient nonverbal   Blood pressure (!) 95/53, pulse 71, temperature 98.8 F (37.1 C), temperature source Axillary, resp. rate 13, SpO2 100 %. Physical Exam  Constitutional: She appears well-developed and well-nourished. She appears lethargic. No distress.  HENT:  Head: Normocephalic.  Cardiovascular: Normal rate and regular rhythm.  Respiratory: Effort normal. No respiratory distress.  Musculoskeletal:  Bilateral shoulder, elbow, wrist, digits- no skin wounds, nontender, no instability, no blocks to motion  Sens  Ax/R/M/U could not assess  Mot   Ax/ R/ PIN/ M/ AIN/ U could not assess  Rad 2+  RLE No traumatic wounds,  ecchymosis, or rash  Nontender  No knee or ankle effusion  Knee stable to varus/ valgus and anterior/posterior stress  Sens DPN, SPN, TN could not assess  Motor EHL, ext, flex, evers could not assess  DP 2+, PT 2+, No significant edema  LLE No traumatic wounds, ecchymosis, or rash  Grimaced when palpating  hip  No knee or ankle effusion  Knee stable to varus/ valgus and anterior/posterior stress  Sens DPN, SPN, TN could not assess  Motor EHL, ext, flex, evers could not assess  DP 2+, PT 2+, No significant edema  Neurological: She appears lethargic.  Skin: Skin is warm and dry. She is not diaphoretic.    Assessment/Plan: Left subcapital femur fx -- This is a very complicated case. From an orthopedic standpoint, if there was no hope of functional recovery of the left leg you could consider non-operative treatment. However, if there is some chance then she would benefit best from ORIF of the hip, likely hemiarthroplasty. If the surgical risk was too great we could attempt to treat non-operatively in traction (this would require skeletal traction) if an adequate closed reduction could be obtained for 6-8 weeks. After speaking with CCM and neurology they feel that she would potentially be stable for surgery at any point. Will await the results of the ethics consultation and decide on a course of action appropriately. Dr. Stann Mainland will follow the patient, and plan tentatively for surgery on Friday.    Lisette Abu, PA-C Orthopedic Surgery [phone] 10/25/2017, 9:40 AM

## 2017-10-25 NOTE — Progress Notes (Signed)
OT [place]   10/25/17 1200  OT Visit Information  Last OT Received On 10/25/17  Reason Eval/Treat Not Completed Patient not medically ready. Per RN, pt on bedrest till 10/25/17. Will return as schedule allows and pt medically ready per MD. Thank you.   Aralynn Brake MSOT, OTR/L Acute Rehab Pager: [phone] Office: [phone]

## 2017-10-25 NOTE — Progress Notes (Signed)
   10/25/17 1200  Clinical Encounter Type  Visited With Patient  Consult/Referral To Chaplain  PT was not available during chaplain visit.  Nurses were in the room but PT not able to speak.  Chaplain prayed at room entrance for PT

## 2017-10-25 NOTE — Consult Note (Addendum)
WOC Nurse wound consult note Reason for Consult: Consult requested for bleeding scalp wound.  Pt was admitted from another facility with bandage to head in place and mod amt bleeding underneath the wrap.  She had an elevated INR at the time and area continued to ooze, according to the EMR, so it was left in place.  Bandage removed and there is no further bleeding noted at this time.  There is a large amt dried blood around a full thickness laceration area which is difficult to visualize, related to old blood and matted hair. Dressing procedure/placement/frequency: Apply neosporin Q day for antimicrobial benefits and to promote moist healing, then dry abd pad and kerlex to protect from further injury. No family present to discuss plan of care. Please re-consult if further assistance is needed.  Thank-you,  Cammie Mcgeeawn Mairyn Lenahan MSN, RN, CWOCN, [place]-AP, CNS [phone]

## 2017-10-25 NOTE — Progress Notes (Signed)
  Echocardiogram 2D Echocardiogram has been performed.  Carol Parker Carol Parker 10/25/2017, 11:04 AM

## 2017-10-25 NOTE — Progress Notes (Signed)
STROKE TEAM PROGRESS NOTE   SUBJECTIVE (INTERVAL HISTORY) Her RN is at the bedside.  Overall she feels her condition is stable. Her scalp bleeding seems stopped. Will have wound care nurse to take a look. Her BP stable at 120s. Pending speech. Hb stable after PRBC transfusion. Orthopedics is seeing her. She is very lethargic, difficulty open eyes, but able to follow simple commands. Very dysarthric with limited speech. Left hemiplegic. Ethic committee contacted.   OBJECTIVE Temp:  [97.4 F (36.3 C)-98.8 F (37.1 C)] 98.8 F (37.1 C) (11/05 0800) Pulse Rate:  [65-84] 71 (11/05 0800) Cardiac Rhythm: (P) Atrial fibrillation (11/05 0800) Resp:  [11-23] 13 (11/05 0800) BP: (84-175)/(43-85) 95/53 (11/05 0800) SpO2:  [98 %-100 %] 100 % (11/05 0800) Arterial Line BP: (112-171)/(45-92) 112/45 (11/05 0800)  Recent Labs  Lab 07-14-2017 1643 07-14-2017 2216 10/25/17 0725  GLUCAP 117* 118* 133*   Recent Labs  Lab 07-14-2017 1504 07-14-2017 2102 10/25/17 0136 10/25/17 0510 10/25/17 0758  NA 135 132* 133* 136 136  K 3.1* 3.6  --  4.1  --   CL  --  106  --  108  --   CO2  --  19*  --  18*  --   GLUCOSE  --  144*  --  124*  --   BUN  --  13  --  13  --   CREATININE  --  0.71  --  0.71  --   CALCIUM  --  7.6*  --  7.8*  --   MG  --  1.3*  --  2.5*  --   PHOS  --  3.2  --  2.7  --    No results for input(s): AST, ALT, ALKPHOS, BILITOT, PROT, ALBUMIN in the last 168 hours. Recent Labs  Lab 07-14-2017 1504 07-14-2017 2102 07-14-2017 2322 10/25/17 0510  WBC  --  7.6 6.7 6.9  NEUTROABS  --   --  5.5 5.4  HGB 6.5* 8.1* 9.0* 8.6*  HCT 19.0* 23.9* 25.7* 24.9*  MCV  --  88.8 87.4 86.5  PLT  --  90* 78* 85*   No results for input(s): CKTOTAL, CKMB, CKMBINDEX, TROPONINI in the last 168 hours. Recent Labs    07-14-2017 2102  LABPROT 16.9*  INR 1.38   No results for input(s): COLORURINE, LABSPEC, PHURINE, GLUCOSEU, HGBUR, BILIRUBINUR, KETONESUR, PROTEINUR, UROBILINOGEN, NITRITE, LEUKOCYTESUR in the  last 72 hours.  Invalid input(s): APPERANCEUR     Component Value Date/Time   CHOL 108 10/25/2017 0510   TRIG 79 10/25/2017 0510   HDL 41 10/25/2017 0510   CHOLHDL 2.6 10/25/2017 0510   VLDL 16 10/25/2017 0510   LDLCALC 51 10/25/2017 0510   Lab Results  Component Value Date   HGBA1C 5.8 (H) 10/25/2017   No results found for: LABOPIA, COCAINSCRNUR, LABBENZ, AMPHETMU, THCU, LABBARB  No results for input(s): ETH in the last 168 hours.  I have personally reviewed the radiological images below and agree with the radiology interpretations.  Ct Head Wo Contrast 10/25/2017 IMPRESSION: 1. Multifocal intraparenchymal hemorrhage, some of which appear smaller most compatible with a component of resolving contrast staining. 2. Increasing intraventricular blood products with mild LEFT ventricle entrapment. 5 mm RIGHT to LEFT midline shift, decreased from 7 mm. 3. Small volume scattered subarachnoid hemorrhage. 4. Moderate LEFT parietoccipital scalp hematoma.   10/22/2017 IMPRESSION: 1. Multiple sites of parenchymal hemorrhage involving the right frontal lobe, right basal ganglia, and right temporal lobe. 2. Progressive hemorrhage in the right  occipital lobe. 3. Extensive effacement of the sulci over the right hemispheric and 6 mm of right-to-left midline shift. 4. Diffuse subarachnoid hemorrhage is now present. 5. Intraventricular hemorrhages noted with early dilation of the left temporal tip.  2017-11-16 IMPRESSION: 1. Small new multifocal areas of abnormal hyperdensity in the right hemisphere. Although some of these in the right MCA territory might reflect post ischemic contrast staining, there is at least a small volume of acute hemorrhage, including that seen at the right occipital pole which is probably in the subarachnoid space. This was discussed in person with Dr. Milon Dikes on November 16, 2017 at 1200 hours. 2. No intracranial mass effect. No new areas of cytotoxic edema in the right MCA territory.  3. New or increased left posterior scalp hematoma since 0902 hours today, 7 mm in thickness. No underlying skull fracture.   Cerebral angio 2017/11/16 IMPRESSION: Status post cerebral angiogram and successful mechanical thrombectomy of occluded right superior division M2 branch, with restoration of TICI 3 flow. Closure of right common femoral artery access with overlapping suture mediated devices.   MRI and MRA pending  2D Echocardiogram  - LVEF 60-65%, normal wall thickness, normal wall motion, mild AI,   mild to moderate MR, moderate biatrial enlargement, moderate TR,   RVSP 62 mmHg, dilated IVC.   PHYSICAL EXAM  Temp:  [97.4 F (36.3 C)-98.8 F (37.1 C)] 98.8 F (37.1 C) (11/05 0800) Pulse Rate:  [65-84] 71 (11/05 0800) Resp:  [11-23] 13 (11/05 0800) BP: (84-175)/(43-85) 95/53 (11/05 0800) SpO2:  [98 %-100 %] 100 % (11/05 0800) Arterial Line BP: (112-171)/(45-92) 112/45 (11/05 0800)  General - thin lady, well developed, eyes closed without distress, on trying to wake up she was moaning.  Ophthalmologic - fundi not visualized due to noncooperation.  Cardiovascular - irregularly irregular heart rate and rhythm.  Neuro - eyes closed, not able to open up on command, however, able to follow other central and peripheral commands.  Eyes medial position, seems to have right gaze preference, but able to move to the left, however lack of effort on exam, PERRL.  Left facial droop, tongue midline in mouth. RUE and RLE spontaneous movement, against gravity, follows commands on the right side. LUE and LLE no spontaneous movement, 0/5 on pain stimulation. Sensation, coordination and gait not tested.   ASSESSMENT/PLAN Ms. Heide HARLEE ECKROTH is a 83 y.o. female with history of HTN, DM, A. fib on Xarelto, UTI with recent fall pending for left hip surgery off Xarelto for 2 days admitted for acute onset left side weakness, facial droop, altered mental status. tPA given at [facility].   Stroke:   right MCA infarct with right M1 occlusion s/p tPA resulting small right frontal and occipital SAH, underwent IR mechanical thrombectomy for right MCA TICI3 revascularization, but causing right frontal, temporal and occipital large hematomas with SAH and IVH  Resultant lethargic, left facial droop, left hemiplegia  CTA head and neck - right M1 occlusion with relatively good collateral circulation on the right hemisphere  CT head s/p tPA - hyperdensity right BG could be due to contrast standing, however, small hyperdensity right frontal and occipital likely due to small SAH  DSA - right M1/M2 revascularization with TICI 3 reperfusion  CT head repeat s/p IR x 2 - right large frontal, temporal and occipital hematomas with SAH and small amount IVH  MRI pending  MRA pending  2D Echo EF 60-65%  LDL 79  HgbA1c 5.8  SCDs for VTE prophylaxis  Diet NPO  time specified   Xarelto (rivaroxaban) daily off for 2 days prior to admission, now on No antithrombotic due to ICH  Will consider nimodipine for vasospasm when po access and BP allows.  Ongoing aggressive stroke risk factor management  Therapy recommendations:  Pending  Disposition: Pending  Cerebral edema  Midline shift stable at 5mm  On 3% saline  Allow slow trending up due to baseline hyponatremia  Need central line for continued 3% saline  Hydrocephalus   Slight hydrocephalus on the left lateral ventricles  Close monitoring  No surgical intervention for now  Left scalp laceration  S/p fall  Became purfusely bleeding after tPA  Compress dressing  Seems stopped  Wound care consulted  Anemia requiring transfusion  Hb down to 6.5   S/p PRBC  Hb 9.0->8.6->7.7  Close monitoring  PRBC transfusion if Hb less than 7.0  Left femoral head fracuture  Ortho on board  If pt remains stable, may consider surgery at the end of this week  Keep bed rest for now  Ethics   no next of kin or blood relatives  that are alive.    3 friends from church listed above, who are her social support system.    No medical power of attorney listed.  One of the friends Mr. [name], reported that when asked about resuscitation and CODE STATUS, prior to admission at [facility] for the hip fracture, she said she would want to be resuscitated.  None of them could tell if she would like to be resuscitated knowing that she has large amount of brain bleeds and possibly poor chance of good functional outcome  Ethics consult placed - they will come to see pt  Diabetes  HgbA1c 5.8 goal < 7.0  Controlled  CBG monitoring  SSI  Hypertension Stable, BP on the low side BP goal < 160 due to [facility] and ICH  Hyperlipidemia  Home meds:  none   LDL 79, goal < 70  No need to start statin at this time  Other Stroke Risk Factors  Advanced age  Other Active Problems  Hyponatremia  Thrombocytopenia   Hospital day # 1  This patient is critically ill due to right MCA infarct, s/p IR with SAH, IVH, and hematoma on the right hemisphere, afib and at significant risk of neurological worsening, death form cerebral edema, brain herniation, shock, heart failure, recurrent stroke and ICH expansion. This patient's care requires constant monitoring of vital signs, hemodynamics, respiratory and cardiac monitoring, review of multiple databases, neurological assessment, discussion with family, other specialists and medical decision making of high complexity. I spent 45 minutes of neurocritical care time in the care of this patient.  Marvel Plan, MD PhD Stroke Neurology 10/25/2017 9:28 AM    To contact Stroke Continuity provider, please refer to [email or web address]. After hours, contact General Neurology

## 2017-10-25 NOTE — H&P (Signed)
PT Cancellation Note  Patient Details Name: Carol BrasilSusie M Parker MRN: [id number] DOB: 08-07-1929   Cancelled Treatment:    Reason Eval/Treat Not Completed: Medical issues which prohibited therapy(pt remains on strict bedrest and await hip sx)   [name] [name] 10/25/2017, 7:15 AM  Delaney MeigsMaija Tabor [name], PT [phone]

## 2017-10-25 NOTE — Progress Notes (Signed)
Patient ID: Carol Parker, female   DOB: 24-Aug-1929, 83 y.o.   MRN: [id number]    Referring Physician(s): Dr. Milon Dikes  Supervising Physician: Gilmer Mor  Patient Status: [facility] - In-pt  Chief Complaint: CVA  Subjective: Patient is drowsy.  Will open her eyes slightly when yelled at, but otherwise mostly sleeps.  Allergies: Penicillins  Medications: Prior to Admission medications   Medication Sig Start Date End Date Taking? Authorizing Provider  albuterol (PROVENTIL HFA;VENTOLIN HFA) 108 (90 Base) MCG/ACT inhaler Inhale 2 puffs every 4 (four) hours as needed into the lungs for wheezing or shortness of breath.    Yes [provider]  calcium carbonate (CALCIUM 600) 1500 (600 Ca) MG TABS tablet Take 600 mg of elemental calcium 2 (two) times daily with a meal by mouth.   Yes [provider]  Cyanocobalamin (VITAMIN B 12 PO) Take 2,500 mcg daily by mouth.   Yes [provider]  losartan (COZAAR) 50 MG tablet Take 50 mg daily by mouth.   Yes [provider]  metFORMIN (GLUCOPHAGE-XR) 500 MG 24 hr tablet Take 500 mg daily by mouth.   Yes [provider]  metoprolol succinate (TOPROL-XL) 50 MG 24 hr tablet Take 50 mg daily by mouth. Take with or immediately following a meal.   Yes [provider]  Rivaroxaban (XARELTO) 15 MG TABS tablet Take 15 mg daily by mouth.    Yes [provider]  terbinafine (LAMISIL) 1 % cream Apply 1 application topically 2 (two) times daily. 02/04/17  Yes Stover, Titorya, DPM  terbinafine (LAMISIL) 250 MG tablet Take 250 mg every other day by mouth.   Yes [provider]  triamcinolone cream (KENALOG) 0.5 % Apply 1 application See admin instructions topically. Filled 07/19/17: apply topically to feet three times daily for 3 weeks, stop for 1 week, then repeat process   Yes [provider]  lidocaine (XYLOCAINE) 5 % ointment Apply 1 application topically 4 (four) times daily as  needed. Patient not taking: Reported on 10/25/2017 12/02/16   Asencion Islam, DPM    Vital Signs: BP (!) 120/52   Pulse 76   Temp 98.2 F (36.8 C) (Axillary)   Resp 14   SpO2 98%   Physical Exam: Neuro: drowsy and sleeping.  Will arouse slightly.  Wiggles toes on right.  Withdraws to pain on left.  Otherwise no meaningful movement in left upper or lower extremity. Skin: R CFA stick site is c/d/i  Imaging: Ct Head Wo Contrast  Result Date: 10/25/2017 CLINICAL DATA:  Altered level of consciousness. History of RIGHT ICA occlusion, mechanical thrombectomy of the M2 occlusion. EXAM: CT HEAD WITHOUT CONTRAST TECHNIQUE: Contiguous axial images were obtained from the base of the skull through the vertex without intravenous contrast. COMPARISON:  Multiple CT 11/04/2017 back or FINDINGS: BRAIN: Evolving RIGHT frontal lobe intraparenchymal hematoma with marginal a hazy density. Smaller RIGHT basal ganglia hemorrhage, less conspicuous than prior CT. Small similar RIGHT occipital lobe hemorrhage. Smaller RIGHT temporal lobe indistinct hemorrhage. Subcentimeter LEFT insular hemorrhage versus subarachnoid blood products. Increased intraventricular blood products. 5 mm RIGHT to LEFT midline shift, decreased from 7 mm. Mild similar LEFT ventricular entrapment. Subarachnoid hemorrhage RIGHT basal cistern. Mild RIGHT uncal herniation. VASCULAR: Mild calcific atherosclerosis of the carotid siphons. SKULL: No skull fracture. Moderate LEFT parietoccipital scalp hematoma. SINUSES/ORBITS: Severe chronic pan paranasal sinusitis. Mastoid air cells are well aerated.The included ocular globes and orbital contents are non-suspicious. OTHER: None. IMPRESSION: 1. Multifocal intraparenchymal hemorrhage,  some of which appear smaller most compatible with a component of resolving contrast staining. 2. Increasing intraventricular blood products with mild LEFT ventricle entrapment. 5 mm RIGHT to LEFT midline shift, decreased  from 7 mm. 3. Small volume scattered subarachnoid hemorrhage. 4. Moderate LEFT parietoccipital scalp hematoma. Electronically Signed   By: Awilda Metro M.D.   On: 10/25/2017 02:03   Ct Head Wo Contrast  Result Date: Nov 01, 2017 CLINICAL DATA:  Status post revascularization of the right middle cerebral artery. EXAM: CT HEAD WITHOUT CONTRAST TECHNIQUE: Contiguous axial images were obtained from the base of the skull through the vertex without intravenous contrast. COMPARISON:  CT head without contrast from the same day at 11:50 a.m. FINDINGS: Brain: There significant increase in size and number of multiple foci of parenchymal hemorrhage. The largest is in the right frontal lobe, measuring 3.6 x 3.8 x 6.0 cm. There is hemorrhage into the right basal ganglia and anterior limb internal capsule. Right occipital lobe hemorrhages noted. Hemorrhage in the medial right temporal lobe measures 3.4 x 2.5 x 4.8 cm. Diffuse subarachnoid hemorrhage is present involving the suprasellar cisterns and along the tentorium. Minimal blood is noted within the ventricles, posteriorly on the left. There is some dilation of the left temporal tip suggesting early hydrocephalus. The para mesencephalic cisterns are opacified. Right-to-left midline shift measures 6.5 mm. Vascular: Atherosclerotic calcifications are present within the cavernous internal carotid artery is bilaterally. There is no residual hyperdense vessel. Skull: Calvarium is intact. Sinuses/Orbits: Chronic sinus disease is again seen. Bilateral lens replacements are noted. IMPRESSION: 1. Multiple sites of parenchymal hemorrhage involving the right frontal lobe, right basal ganglia, and right temporal lobe. 2. Progressive hemorrhage in the right occipital lobe. 3. Extensive effacement of the sulci over the right hemispheric and 6 mm of right-to-left midline shift. 4. Diffuse subarachnoid hemorrhage is now present. 5. Intraventricular hemorrhages noted with early dilation  of the left temporal tip. Critical Value/emergent results were called by telephone at the time of interpretation on 11/01/2017 at 4:36 pm to Dr. Gilmer Mor , who verbally acknowledged these results. Electronically Signed   By: Marin Roberts M.D.   On: 01-Nov-2017 16:38   Ct Head Wo Contrast  Result Date: 11/01/2017 CLINICAL DATA:  83 year old female transferred from [facility] for emergent large vessel occlusion, right ICA terminus and right MCA. Now status post IV tPA, and transfer to [facility]. EXAM: CT HEAD WITHOUT CONTRAST TECHNIQUE: Contiguous axial images were obtained from the base of the skull through the vertex without intravenous contrast. COMPARISON:  CTA head and neck and noncontrast head CT [facility] 0903 hours today. FINDINGS: Brain: Along the medial aspect of the right lentiform hypodensity seen earlier today there is now crescent-shaped and other scattered abnormal hyperdensity versus enhancement. There is more curvilinear abnormal hyperdensity or enhancement in the left inferior frontal gyrus (series 3, image 16). There is more confluent hyperdensity or enhancement in the mesial right temporal lobe (image 14) and also new curvilinear hyperdensity also at the right occipital pole (same image). There is also trace curvilinear hyperdensity along the more superior right frontal convexity (image 22). No associated mass effect. No intraventricular hemorrhage. No other extra-axial hemorrhage. No new areas of cytotoxic edema identified. Left hemisphere and posterior fossa gray-white matter differentiation is stable. Vascular: Residual intravascular contrast is present. Calcified atherosclerosis at the skull base. Skull: No acute osseous abnormality identified. Sinuses/Orbits: Stable chronic sinusitis findings. Tympanic cavities and mastoids remain clear. Other: The patient has a broad-based left posterior  convexity scalp hematoma measuring up to 7 mm in thickness which is  new or increased from the CTA head earlier today (series 4, image 35). The underlying calvarium is intact. Other scalp soft tissues appear stable and within normal limits. No acute orbit soft tissue findings. IMPRESSION: 1. Small new multifocal areas of abnormal hyperdensity in the right hemisphere. Although some of these in the right MCA territory might reflect post ischemic contrast staining, there is at least a small volume of acute hemorrhage, including that seen at the right occipital pole which is probably in the subarachnoid space. This was discussed in person with Dr. Milon Dikes on 11/13/17 at 1200 hours. 2. No intracranial mass effect. No new areas of cytotoxic edema in the right MCA territory. 3. New or increased left posterior scalp hematoma since 0902 hours today, 7 mm in thickness. No underlying skull fracture. Electronically Signed   By: Odessa Fleming M.D.   On: 11/13/17 12:44   Ir US Guide Vasc Access Right  Result Date: 2017/11/13 INDICATION: 83 year old female with a history of acute right MCA syndrome, normal baseline, stroke exam of 12, CT imaging showing large vessel occlusion. She has been transferred after a dose of IV tPA, formed attempt at mechanical thrombectomy. CT performed to evaluate for ASPECTS decay shows stable aspects score, however, hemorrhagic transformation in several small peripheral regions and basal ganglia. After a thorough discussion of risks and benefits with the Stroke Neurology team, it is felt that attempting mechanical thrombectomy will give the patient the best chance of recovery. EXAM: ULTRASOUND GUIDED ACCESS RIGHT COMMON FEMORAL ARTERY CEREBRAL ANGIOGRAM MECHANICAL THROMBECTOMY RIGHT M2 BRANCH SUTURE MEDIATED CLOSURE OF RIGHT COMMON FEMORAL ARTERY ACCESS COMPARISON:  CT imaging same day MEDICATIONS: Vancomycin 1 gm IV. The antibiotic was administered within 1 hour of the procedure ANESTHESIA/SEDATION: General anesthesia CONTRAST:  148 cc Isovue FLUOROSCOPY TIME:   Fluoroscopy Time: 56 minutes 48 seconds (1,910 mGy). COMPLICATIONS: None immediate. TECHNIQUE: Emergent consent was obtained/presumed from as there is no family available. Specific risks include: Bleeding, infection, contrast reaction, kidney injury/failure, need for further procedure/surgery, arterial injury or dissection, embolization to new territory, intracranial hemorrhage (10-15% risk), neurologic deterioration, cardiopulmonary collapse, death. All questions were addressed. Maximal Sterile Barrier Technique was utilized including during the procedure including caps, mask, sterile gowns, sterile gloves, sterile drape, hand hygiene and skin antiseptic. A timeout was performed prior to the initiation of the procedure. FINDINGS: Initial: Significant tortuosity at the aortic arch with type 3 arch Right common carotid artery: Mild tortuosity of the common carotid artery without significant plaque. Right external carotid artery: Patent with antegrade flow. Right internal carotid artery: Normal course caliber and contour of the cervical portion. Vertical and petrous segment patent with normal course caliber contour. Cavernous segment patent. Clinoid segment patent. Antegrade flow of the ophthalmic artery. Ophthalmic segment patent. Terminus patent. Right MCA:  M1 segment patent.  Occlusion of superior division. Right ACA: A 1 segment patent. A 2 segment perfuses the right territory. Cross filling of the communicating artery with some flash filling of the left anterior cerebral territory. Final: Final angiogram demonstrates restoration of TICI3 flow of the MCA territory. PROCEDURE: Patient is brought to the interventional radiology suite with the patient's identity confirmed. Patient positioned supine position on the fluoroscopy table. General anesthesia was induced by the anesthesia team. Patient is then prepped and draped in the usual sterile fashion. Ultrasound survey of the right inguinal region was performed with  images stored and sent to PACs. 11 blade  scalpel was used to make a small incision. A micropuncture needle was used access the right common femoral artery under ultrasound. With excellent arterial blood flow returned, an .018 micro wire was passed through the needle, observed to enter the abdominal aorta under fluoroscopy. The needle was removed, and a micropuncture sheath was placed over the wire. The inner dilator and wire were removed, and an 035 Bentson wire was advanced under fluoroscopy into the abdominal aorta. The sheath was removed and a standard 5 [place] vascular sheath was placed. The dilator was removed and the sheath was flushed. A 31F JB-1 diagnostic catheter was advanced over the wire to the proximal descending thoracic aorta. Wire was then removed. Double flush of the catheter was performed. Catheter was then used to select the innominate artery. Angiogram was performed. Using roadmap technique, the catheter was advanced over a roadrunner wire, immediately flipping into the aortic arch given the tortuosity. Roadrunner wire was removed and a Glidewire was used. Using the Glidewire, catheter was advanced into the proximal segment of the internal carotid artery. Attempt a passing the Rosen wire was initially unsuccessful, as the diagnostic catheter withdrew into the proximal internal carotid artery. Roadrunner wire was then passed into the distal ICA, and the diagnostic catheter was then repositioned into the distal ICA. Rosen wire was then placed into the distal ICA. Formal angiogram was performed. The 5 French sheath was removed and exchanged for 8 French 55 centimeter BrightTip sheath. Sheath was flushed and attached to pressurized and heparinized saline bag for constant forward flow. Then an 8 [place], 85 cm Flowgate balloon tip catheter was prepared on the back table with inflation of the balloon with 50/50 concentration of dilute contrast. The balloon catheter was then advanced over the wire, an  using the coaxial Bern shaped catheter, positioned into the mid internal carotid artery. Copious back flush was performed and the balloon catheter was attached to heparinized and pressurized saline bag for forward flow. Angiogram was performed for road map guide. Microcatheter system was then introduced through the balloon guide catheter, using a synchro soft 014 wire and a Trevo Provue18 catheter. Microcatheter system was advanced into the internal carotid artery, to the level of the occlusion. The micro wire was then carefully advanced through the occluded M2 segment. Microcatheter would not pass over the wire through the occluded segment without significant tension on the system, which caused the balloon guide catheter to prolapse into the aortic arch. A was necessary to removed microcatheter and microwire, and reduce the system by removal of the balloon guide catheter. JB 1 diagnostic catheter and Glidewire were then used to select the innominate artery. JB 1 catheter was advanced into the internal carotid artery over the standard Glidewire. Again, the roadrunner wire was then placed into the distal internal carotid artery an the JB 1 catheter was advanced distally to the internal carotid artery. Rosen wire was then placed to the distal internal carotid artery. Eight [place] Merci balloon guide catheter was then advanced over the Rosen wire into the internal carotid artery, using coaxial straight catheter/introducer. Once the Merci balloon guide catheter was in place, roadmap angiogram was performed. Microcatheter and synchro soft wire were then used to navigate to the face of the occlusion. Microcatheter was then push through the occluded segment and the wire was removed. Blood was then aspirated through the hub of the microcatheter, and a gentle contrast injection was performed confirming intraluminal position. A rotating hemostatic valve was then attached to the back end of  the microcatheter, and a pressurized  and heparinized saline bag was attached to the catheter. 4 x 40 solitaire device was then selected. Back flush was achieved at the rotating hemostatic valve, and then the device was gently advanced through the microcatheter to the distal end. The retriever was then unsheathed by withdrawing the microcatheter under fluoroscopy. Once the retriever was completely unsheathed, control angiogram was performed from the balloon catheter. The balloon at the balloon tip catheter was then inflated under fluoroscopy for proximal flow arrest. Constant aspiration was then performed at the tip of the balloon guide catheter as the retriever was gently and slowly withdrawn with fluoroscopic observation. Once the retriever was entirely removed from the system, free aspiration was confirmed at the hub of the balloon guide catheter, with free blood return confirmed. The balloon was then deflated, rotating hemostatic valve was reattached, and a control angiogram was performed. Segment remained occluded after the first pass. Again, the microcatheter and microwire were advanced to the face of the occlusion, and through the occlusion into the angular branch. Wire was removed. Blood was then aspirated through the hub of the microcatheter, and a gentle contrast injection was performed confirming intraluminal position. A rotating hemostatic valve was then attached to the back end of the microcatheter, and a pressurized and heparinized saline bag was attached to the catheter. 4 x 40 solitaire device was then selected. Back flush was achieved at the rotating hemostatic valve, and then the device was gently advanced through the microcatheter to the distal end. The retriever was then unsheathed by withdrawing the microcatheter under fluoroscopy. Once the retriever was completely unsheathed, control angiogram was performed from the balloon catheter. Segment remained occluded after the second pass. A cat 5 intermediate catheter was then loaded with  the coaxial Trevo microcatheter. Combination the microcatheter and the intermediate catheter were then advanced to the proximal aspect of the clot. Wire was navigated into the angular branch with a microcatheter passed into the angular branch. Cat 5 catheter was advanced into the distal M1 segment. 4 x 40 solitaire device was then again selected and deployed. Mercy balloon catheter was inflated. After deployment, the cat 5 catheter was advanced under aspiration over the solitaire into the origin of the occluded segment. The entire system was then removed. Angiogram was performed, confirming restoration of TICI 3 flow. Control angiogram was performed at the right common femoral artery puncture site. The 55 centimeter 8 French sheath was removed over the roadrunner wire. Overlapping suture devices were used for closure of the right common femoral artery access. Patient tolerated the procedure well and remained hemodynamically stable throughout. Estimated blood loss from a scalp laceration is approximately 200 cc. IMPRESSION: Status post cerebral angiogram and successful mechanical thrombectomy of occluded right superior division M2 branch, with restoration of TICI 3 flow. Closure of right common femoral artery access with overlapping suture mediated devices. Signed, Yvone Neu. Loreta Ave DO [facility] Radiology PLAN: Patient extubated in IR. Transport to CT for post CT Overnight ICU admission Right leg straight overnight for hemostasis. Frequent neurovascular checks. Electronically Signed   By: Gilmer Mor D.O.   On: 11/04/2017 16:41   Dg Chest Port 1 View  Result Date: 10/25/2017 CLINICAL DATA:  Status post central line placement. Acute CVA. History of asthma and hypertension. EXAM: PORTABLE CHEST 1 VIEW COMPARISON:  Chest x-ray of October 24, 2017 FINDINGS: The lungs are well-expanded. There is no focal infiltrate. There is no pleural effusion or pneumothorax. The  left subclavian venous catheter tip projects over the midportion of the SVC. The heart is mildly enlarged. The pulmonary vascularity is normal. There is no IMPRESSION: No postprocedure complication following left internal jugular venous catheter placement. Mild cardiomegaly without pulmonary edema. Thoracic aortic atherosclerosis. Electronically Signed   By: David  [place] M.D.   On: 10/25/2017 10:09   Dg Chest Port 1 View  Result Date: 10/25/2017 CLINICAL DATA:  Abnormal chest x-ray EXAM: PORTABLE CHEST 1 VIEW COMPARISON:  06/08/2017 FINDINGS: Mild to moderate cardiomegaly. Aortic atherosclerosis. No consolidation or effusion. No pneumothorax. IMPRESSION: Cardiomegaly without edema or infiltrate. Electronically Signed   By: Jasmine Pang M.D.   On: 10/25/2017 03:45   Ir Percutaneous Art Thrombectomy/infusion Intracranial Inc Diag Angio  Result Date: 10/27/2017 INDICATION: 83 year old female with a history of acute right MCA syndrome, normal baseline, stroke exam of 12, CT imaging showing large vessel occlusion. She has been transferred after a dose of IV tPA, formed attempt at mechanical thrombectomy. CT performed to evaluate for ASPECTS decay shows stable aspects score, however, hemorrhagic transformation in several small peripheral regions and basal ganglia. After a thorough discussion of risks and benefits with the Stroke Neurology team, it is felt that attempting mechanical thrombectomy will give the patient the best chance of recovery. EXAM: ULTRASOUND GUIDED ACCESS RIGHT COMMON FEMORAL ARTERY CEREBRAL ANGIOGRAM MECHANICAL THROMBECTOMY RIGHT M2 BRANCH SUTURE MEDIATED CLOSURE OF RIGHT COMMON FEMORAL ARTERY ACCESS COMPARISON:  CT imaging same day MEDICATIONS: Vancomycin 1 gm IV. The antibiotic was administered within 1 hour of the procedure ANESTHESIA/SEDATION: General anesthesia CONTRAST:  148 cc Isovue FLUOROSCOPY TIME:  Fluoroscopy Time: 56 minutes 48 seconds (1,910 mGy). COMPLICATIONS: None immediate.  TECHNIQUE: Emergent consent was obtained/presumed from as there is no family available. Specific risks include: Bleeding, infection, contrast reaction, kidney injury/failure, need for further procedure/surgery, arterial injury or dissection, embolization to new territory, intracranial hemorrhage (10-15% risk), neurologic deterioration, cardiopulmonary collapse, death. All questions were addressed. Maximal Sterile Barrier Technique was utilized including during the procedure including caps, mask, sterile gowns, sterile gloves, sterile drape, hand hygiene and skin antiseptic. A timeout was performed prior to the initiation of the procedure. FINDINGS: Initial: Significant tortuosity at the aortic arch with type 3 arch Right common carotid artery: Mild tortuosity of the common carotid artery without significant plaque. Right external carotid artery: Patent with antegrade flow. Right internal carotid artery: Normal course caliber and contour of the cervical portion. Vertical and petrous segment patent with normal course caliber contour. Cavernous segment patent. Clinoid segment patent. Antegrade flow of the ophthalmic artery. Ophthalmic segment patent. Terminus patent. Right MCA:  M1 segment patent.  Occlusion of superior division. Right ACA: A 1 segment patent. A 2 segment perfuses the right territory. Cross filling of the communicating artery with some flash filling of the left anterior cerebral territory. Final: Final angiogram demonstrates restoration of TICI3 flow of the MCA territory. PROCEDURE: Patient is brought to the interventional radiology suite with the patient's identity confirmed. Patient positioned supine position on the fluoroscopy table. General anesthesia was induced by the anesthesia team. Patient is then prepped and draped in the usual sterile fashion. Ultrasound survey of the right inguinal region was performed with images stored and sent to PACs. 11 blade scalpel was used to make a small incision.  A micropuncture needle was used access the right common femoral artery under ultrasound. With excellent arterial blood flow returned, an .018 micro wire was passed through the needle, observed to enter the abdominal aorta under fluoroscopy. The needle  was removed, and a micropuncture sheath was placed over the wire. The inner dilator and wire were removed, and an 035 Bentson wire was advanced under fluoroscopy into the abdominal aorta. The sheath was removed and a standard 5 [place] vascular sheath was placed. The dilator was removed and the sheath was flushed. A 22F JB-1 diagnostic catheter was advanced over the wire to the proximal descending thoracic aorta. Wire was then removed. Double flush of the catheter was performed. Catheter was then used to select the innominate artery. Angiogram was performed. Using roadmap technique, the catheter was advanced over a roadrunner wire, immediately flipping into the aortic arch given the tortuosity. Roadrunner wire was removed and a Glidewire was used. Using the Glidewire, catheter was advanced into the proximal segment of the internal carotid artery. Attempt a passing the Rosen wire was initially unsuccessful, as the diagnostic catheter withdrew into the proximal internal carotid artery. Roadrunner wire was then passed into the distal ICA, and the diagnostic catheter was then repositioned into the distal ICA. Rosen wire was then placed into the distal ICA. Formal angiogram was performed. The 5 French sheath was removed and exchanged for 8 French 55 centimeter BrightTip sheath. Sheath was flushed and attached to pressurized and heparinized saline bag for constant forward flow. Then an 8 [place], 85 cm Flowgate balloon tip catheter was prepared on the back table with inflation of the balloon with 50/50 concentration of dilute contrast. The balloon catheter was then advanced over the wire, an using the coaxial Bern shaped catheter, positioned into the mid internal carotid  artery. Copious back flush was performed and the balloon catheter was attached to heparinized and pressurized saline bag for forward flow. Angiogram was performed for road map guide. Microcatheter system was then introduced through the balloon guide catheter, using a synchro soft 014 wire and a Trevo Provue18 catheter. Microcatheter system was advanced into the internal carotid artery, to the level of the occlusion. The micro wire was then carefully advanced through the occluded M2 segment. Microcatheter would not pass over the wire through the occluded segment without significant tension on the system, which caused the balloon guide catheter to prolapse into the aortic arch. A was necessary to removed microcatheter and microwire, and reduce the system by removal of the balloon guide catheter. JB 1 diagnostic catheter and Glidewire were then used to select the innominate artery. JB 1 catheter was advanced into the internal carotid artery over the standard Glidewire. Again, the roadrunner wire was then placed into the distal internal carotid artery an the JB 1 catheter was advanced distally to the internal carotid artery. Rosen wire was then placed to the distal internal carotid artery. Eight [place] Merci balloon guide catheter was then advanced over the Rosen wire into the internal carotid artery, using coaxial straight catheter/introducer. Once the Merci balloon guide catheter was in place, roadmap angiogram was performed. Microcatheter and synchro soft wire were then used to navigate to the face of the occlusion. Microcatheter was then push through the occluded segment and the wire was removed. Blood was then aspirated through the hub of the microcatheter, and a gentle contrast injection was performed confirming intraluminal position. A rotating hemostatic valve was then attached to the back end of the microcatheter, and a pressurized and heparinized saline bag was attached to the catheter. 4 x 40 solitaire device  was then selected. Back flush was achieved at the rotating hemostatic valve, and then the device was gently advanced through the microcatheter to the distal end.  The retriever was then unsheathed by withdrawing the microcatheter under fluoroscopy. Once the retriever was completely unsheathed, control angiogram was performed from the balloon catheter. The balloon at the balloon tip catheter was then inflated under fluoroscopy for proximal flow arrest. Constant aspiration was then performed at the tip of the balloon guide catheter as the retriever was gently and slowly withdrawn with fluoroscopic observation. Once the retriever was entirely removed from the system, free aspiration was confirmed at the hub of the balloon guide catheter, with free blood return confirmed. The balloon was then deflated, rotating hemostatic valve was reattached, and a control angiogram was performed. Segment remained occluded after the first pass. Again, the microcatheter and microwire were advanced to the face of the occlusion, and through the occlusion into the angular branch. Wire was removed. Blood was then aspirated through the hub of the microcatheter, and a gentle contrast injection was performed confirming intraluminal position. A rotating hemostatic valve was then attached to the back end of the microcatheter, and a pressurized and heparinized saline bag was attached to the catheter. 4 x 40 solitaire device was then selected. Back flush was achieved at the rotating hemostatic valve, and then the device was gently advanced through the microcatheter to the distal end. The retriever was then unsheathed by withdrawing the microcatheter under fluoroscopy. Once the retriever was completely unsheathed, control angiogram was performed from the balloon catheter. Segment remained occluded after the second pass. A cat 5 intermediate catheter was then loaded with the coaxial Trevo microcatheter. Combination the microcatheter and the  intermediate catheter were then advanced to the proximal aspect of the clot. Wire was navigated into the angular branch with a microcatheter passed into the angular branch. Cat 5 catheter was advanced into the distal M1 segment. 4 x 40 solitaire device was then again selected and deployed. [facility]cy balloon catheter was inflated. After deployment, the cat 5 catheter was advanced under aspiration over the solitaire into the origin of the occluded segment. The entire system was then removed. Angiogram was performed, confirming restoration of TICI 3 flow. Control angiogram was performed at the right common femoral artery puncture site. The 55 centimeter 8 French sheath was removed over the roadrunner wire. Overlapping suture devices were used for closure of the right common femoral artery access. Patient tolerated the procedure well and remained hemodynamically stable throughout. Estimated blood loss from a scalp laceration is approximately 200 cc. IMPRESSION: Status post cerebral angiogram and successful mechanical thrombectomy of occluded right superior division M2 branch, with restoration of TICI 3 flow. Closure of right common femoral artery access with overlapping suture mediated devices. Signed, Yvone NeuJaime S. Loreta AveWagner, DO [facility] PL[facility]: Patient extubated in IR. Transport to CT for post CT Overnight ICU admission Right leg straight overnight for hemostasis. Frequent neurovascular checks. Electronically Signed   By: Gilmer MorJaime  Wagner D.O.   On: 11/12/2017 16:41    Labs:  CBC: Recent Labs    10/23/2017 2102 11/17/2017 2322 10/25/17 0510 10/25/17 1018  WBC 7.6 6.7 6.9 6.2  HGB 8.1* 9.0* 8.6* 7.7*  HCT 23.9* 25.7* 24.9* 22.2*  PLT 90* 78* 85* 84*    COAGS: Recent Labs    10/29/2017 2102  INR 1.38    BMP: Recent Labs    10/25/2017 1504 10/21/2017 2102 10/25/17 0136 10/25/17 0510 10/25/17 0758  NA 135 132* 133* 136 136  K 3.1* 3.6  --  4.1  --   CL   --  106  --  108  --   CO2  --  19*  --  18*  --   GLUCOSE  --  144*  --  124*  --   BUN  --  13  --  13  --   CALCIUM  --  7.6*  --  7.8*  --   CREATININE  --  0.71  --  0.71  --   GFRNONAA  --  >60  --  >60  --   GFRAA  --  >60  --  >60  --     LIVER FUNCTION TESTS: No results for input(s): BILITOT, AST, ALT, ALKPHOS, PROT, ALBUMIN in the last 8760 hours.  Assessment and Plan: 1. CVA, s/p Cerebral angiogram Mechanical thrombectomy of large vessel occlusion of M2 branch of the right MCA  Patient getting MRI/MRA today to further assess head.  She did have some hemorrhage noted yesterday after tPA given. Neurology following and further recommendations per their service at this time.  Electronically Signed: Letha Cape 10/25/2017, 1:08 PM   I spent a total of 15 Minutes at the the patient's bedside AND on the patient's hospital floor or unit, greater than 50% of which was counseling/coordinating care for CVA

## 2017-10-25 NOTE — Progress Notes (Signed)
SBP 120-140. Patient has limited IV access.This RN and 2 other RNs on unit attempted peripheral IV insertion without success. IV team consulted to get more access to be able to start cleviprex gtt to maintain BP goals.

## 2017-10-25 NOTE — Ethics Note (Addendum)
ETHICS:   I met Carol Parker and spoke with her briefly tonight. She was awake to voice, responded to my short questions with an appropriate response. Her speech is garbled, and voice soft. She clearly stated that she has hip pain and wants something for it. She denies any headache or head pain. She is unable to move her left side.  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 That is, the medical care team needs to arrive at a consensus on the plan of care that promotes Carol Parker's best interest in her current restricted quality of life state. There are no apparent surrogates or an Advance directive in place. The question remains, will Carol Parker's neurological status improve with time where she will become capacitated to become an autonomous medical decision-maker?   Suggest a Psychiatry consult to determine capacity now that Carol Parker is awake and communicating somewhat, apparently she was non-communicative but did follow simple commands this am.  In the mean time, if she is experiencing physical suffering- hip pain it should be mitigated. The RN at Carol Parker's bedside has placed a call to the patient's physician.   In regard to surgical intervention, an anesthesia consult may be beneficial to sort out the best anesthetic plan of care for this complex case.  I briefly had a phone conversation with Dr. Erlinda Hong and relayed my findings. On behalf of the Ethics Committee I thank you for involving [place] in this case.  Laureen Ochs CRNA MS-HCE

## 2017-10-26 ENCOUNTER — Inpatient Hospital Stay (HOSPITAL_COMMUNITY): Payer: Medicare Other

## 2017-10-26 ENCOUNTER — Encounter (HOSPITAL_COMMUNITY): Payer: Self-pay | Admitting: Anesthesiology

## 2017-10-26 DIAGNOSIS — G919 Hydrocephalus, unspecified: Secondary | ICD-10-CM

## 2017-10-26 DIAGNOSIS — R440 Auditory hallucinations: Secondary | ICD-10-CM

## 2017-10-26 DIAGNOSIS — Z8781 Personal history of (healed) traumatic fracture: Secondary | ICD-10-CM

## 2017-10-26 DIAGNOSIS — J9601 Acute respiratory failure with hypoxia: Secondary | ICD-10-CM

## 2017-10-26 DIAGNOSIS — J690 Pneumonitis due to inhalation of food and vomit: Secondary | ICD-10-CM

## 2017-10-26 DIAGNOSIS — G936 Cerebral edema: Secondary | ICD-10-CM

## 2017-10-26 DIAGNOSIS — R0902 Hypoxemia: Secondary | ICD-10-CM

## 2017-10-26 DIAGNOSIS — Z452 Encounter for adjustment and management of vascular access device: Secondary | ICD-10-CM

## 2017-10-26 LAB — CBC WITH DIFFERENTIAL/PLATELET
BASOS ABS: 0 10*3/uL (ref 0.0–0.1)
Basophils Relative: 0 %
Eosinophils Absolute: 0.1 10*3/uL (ref 0.0–0.7)
Eosinophils Relative: 2 %
HEMATOCRIT: 21.7 % — AB (ref 36.0–46.0)
Hemoglobin: 7.4 g/dL — ABNORMAL LOW (ref 12.0–15.0)
LYMPHS ABS: 0.7 10*3/uL (ref 0.7–4.0)
Lymphocytes Relative: 13 %
MCH: 30 pg (ref 26.0–34.0)
MCHC: 34.1 g/dL (ref 30.0–36.0)
MCV: 87.9 fL (ref 78.0–100.0)
MONO ABS: 0.6 10*3/uL (ref 0.1–1.0)
MONOS PCT: 13 %
NEUTROS ABS: 3.6 10*3/uL (ref 1.7–7.7)
Neutrophils Relative %: 72 %
PLATELETS: 102 10*3/uL — AB (ref 150–400)
RBC: 2.47 MIL/uL — ABNORMAL LOW (ref 3.87–5.11)
RDW: 14.8 % (ref 11.5–15.5)
WBC: 5 10*3/uL (ref 4.0–10.5)

## 2017-10-26 LAB — BLOOD GAS, ARTERIAL
ACID-BASE DEFICIT: 4.5 mmol/L — AB (ref 0.0–2.0)
ACID-BASE DEFICIT: 5.3 mmol/L — AB (ref 0.0–2.0)
BICARBONATE: 19.1 mmol/L — AB (ref 20.0–28.0)
Bicarbonate: 19.5 mmol/L — ABNORMAL LOW (ref 20.0–28.0)
Drawn by: 51191
Drawn by: 51191
FIO2: 100
LHR: 16 {breaths}/min
MECHVT: 490 mL
O2 SAT: 97.5 %
O2 SAT: 99.2 %
PATIENT TEMPERATURE: 97.8
PCO2 ART: 32.1 mmHg (ref 32.0–48.0)
PCO2 ART: 33.3 mmHg (ref 32.0–48.0)
PEEP/CPAP: 5 cmH2O
PH ART: 7.374 (ref 7.350–7.450)
PO2 ART: 89.8 mmHg (ref 83.0–108.0)
Patient temperature: 98
pH, Arterial: 7.399 (ref 7.350–7.450)
pO2, Arterial: 160 mmHg — ABNORMAL HIGH (ref 83.0–108.0)

## 2017-10-26 LAB — BASIC METABOLIC PANEL
ANION GAP: 3 — AB (ref 5–15)
BUN: 15 mg/dL (ref 6–20)
CHLORIDE: 123 mmol/L — AB (ref 101–111)
CO2: 20 mmol/L — ABNORMAL LOW (ref 22–32)
Calcium: 8 mg/dL — ABNORMAL LOW (ref 8.9–10.3)
Creatinine, Ser: 0.69 mg/dL (ref 0.44–1.00)
Glucose, Bld: 129 mg/dL — ABNORMAL HIGH (ref 65–99)
POTASSIUM: 3.3 mmol/L — AB (ref 3.5–5.1)
SODIUM: 146 mmol/L — AB (ref 135–145)

## 2017-10-26 LAB — SODIUM
Sodium: 151 mmol/L — ABNORMAL HIGH (ref 135–145)
Sodium: 151 mmol/L — ABNORMAL HIGH (ref 135–145)
Sodium: 155 mmol/L — ABNORMAL HIGH (ref 135–145)

## 2017-10-26 LAB — GLUCOSE, CAPILLARY
Glucose-Capillary: 114 mg/dL — ABNORMAL HIGH (ref 65–99)
Glucose-Capillary: 136 mg/dL — ABNORMAL HIGH (ref 65–99)
Glucose-Capillary: 108 mg/dL — ABNORMAL HIGH (ref 65–99)
Glucose-Capillary: 136 mg/dL — ABNORMAL HIGH (ref 65–99)

## 2017-10-26 LAB — PROTIME-INR
INR: 1.25
Prothrombin Time: 15.6 seconds — ABNORMAL HIGH (ref 11.4–15.2)

## 2017-10-26 LAB — PHOSPHORUS: Phosphorus: 1.6 mg/dL — ABNORMAL LOW (ref 2.5–4.6)

## 2017-10-26 LAB — MAGNESIUM: Magnesium: 2 mg/dL (ref 1.7–2.4)

## 2017-10-26 MED ORDER — POTASSIUM CHLORIDE 10 MEQ/100ML IV SOLN: 10.0000 meq | INTRAVENOUS | Status: DC

## 2017-10-26 MED ORDER — POTASSIUM PHOSPHATES 15 MMOLE/5ML IV SOLN
14.0000 mmol | Freq: Once | INTRAVENOUS | Status: AC
  Administered 2017-10-26: 14 mmol via INTRAVENOUS
  Filled 2017-10-26: qty 4.67

## 2017-10-26 MED ORDER — ORAL CARE MOUTH RINSE
15.0000 mL | Freq: Four times a day (QID) | OROMUCOSAL | Status: DC
  Administered 2017-10-26 – 2017-10-27 (×3): 15 mL via OROMUCOSAL

## 2017-10-26 MED ORDER — FENTANYL 2500MCG IN NS 250ML (10MCG/ML) PREMIX INFUSION
25.0000 ug/h | INTRAVENOUS | Status: DC
  Administered 2017-10-26: 50 ug/h via INTRAVENOUS
  Filled 2017-10-26: qty 250

## 2017-10-26 MED ORDER — FENTANYL BOLUS VIA INFUSION
25.0000 ug | INTRAVENOUS | Status: DC | PRN
  Filled 2017-10-26: qty 25

## 2017-10-26 MED ORDER — FAMOTIDINE IN NACL 20-0.9 MG/50ML-% IV SOLN
20.0000 mg | Freq: Two times a day (BID) | INTRAVENOUS | Status: DC
  Administered 2017-10-26 – 2017-10-27 (×2): 20 mg via INTRAVENOUS
  Filled 2017-10-26 (×2): qty 50

## 2017-10-26 MED ORDER — ALBUTEROL SULFATE (2.5 MG/3ML) 0.083% IN NEBU: 2.5000 mg | INHALATION_SOLUTION | RESPIRATORY_TRACT | Status: DC | PRN

## 2017-10-26 MED ORDER — MIDAZOLAM HCL 2 MG/2ML IJ SOLN: 1.0000 mg | INTRAMUSCULAR | Status: DC | PRN

## 2017-10-26 MED ORDER — IPRATROPIUM BROMIDE 0.02 % IN SOLN
0.5000 mg | Freq: Four times a day (QID) | RESPIRATORY_TRACT | Status: DC
  Administered 2017-10-26: 0.5 mg via RESPIRATORY_TRACT

## 2017-10-26 MED ORDER — MIDAZOLAM HCL 2 MG/2ML IJ SOLN
2.0000 mg | Freq: Once | INTRAMUSCULAR | Status: AC
  Administered 2017-10-26: 2 mg via INTRAVENOUS

## 2017-10-26 MED ORDER — CHLORHEXIDINE GLUCONATE 0.12% ORAL RINSE (MEDLINE KIT)
15.0000 mL | Freq: Two times a day (BID) | OROMUCOSAL | Status: DC
  Administered 2017-10-26 – 2017-10-27 (×2): 15 mL via OROMUCOSAL

## 2017-10-26 MED ORDER — ETOMIDATE 2 MG/ML IV SOLN
0.3000 mg/kg | Freq: Once | INTRAVENOUS | Status: AC
  Administered 2017-10-26: 20.76 mg via INTRAVENOUS

## 2017-10-26 MED ORDER — DOCUSATE SODIUM 50 MG/5ML PO LIQD: 100.0000 mg | Freq: Two times a day (BID) | ORAL | Status: DC | PRN

## 2017-10-26 MED ORDER — LEVOFLOXACIN IN D5W 500 MG/100ML IV SOLN
500.0000 mg | INTRAVENOUS | Status: DC
  Administered 2017-10-26: 500 mg via INTRAVENOUS
  Filled 2017-10-26 (×2): qty 100

## 2017-10-26 MED ORDER — MIDAZOLAM HCL 2 MG/2ML IJ SOLN
INTRAMUSCULAR | Status: AC
  Filled 2017-10-26: qty 2

## 2017-10-26 MED ORDER — CLEVIDIPINE BUTYRATE 0.5 MG/ML IV EMUL
0.0000 mg/h | INTRAVENOUS | Status: DC
  Administered 2017-10-26: 2 mg/h via INTRAVENOUS
  Filled 2017-10-26: qty 50

## 2017-10-26 MED ORDER — POTASSIUM CHLORIDE 10 MEQ/50ML IV SOLN
10.0000 meq | INTRAVENOUS | Status: AC
  Administered 2017-10-26 (×4): 10 meq via INTRAVENOUS
  Filled 2017-10-26 (×4): qty 50

## 2017-10-26 MED ORDER — IPRATROPIUM BROMIDE 0.02 % IN SOLN
RESPIRATORY_TRACT | Status: AC
Start: 2017-10-26 — End: 2017-10-26
  Filled 2017-10-26: qty 2.5

## 2017-10-26 MED ORDER — FENTANYL CITRATE (PF) 100 MCG/2ML IJ SOLN
50.0000 ug | Freq: Once | INTRAMUSCULAR | Status: AC
  Administered 2017-10-26: 50 ug via INTRAVENOUS

## 2017-10-26 MED ORDER — DEXTROSE 5 % IV SOLN
2.0000 g | Freq: Three times a day (TID) | INTRAVENOUS | Status: DC
  Administered 2017-10-27 (×2): 2 g via INTRAVENOUS
  Filled 2017-10-26 (×4): qty 2

## 2017-10-26 MED ORDER — SUCCINYLCHOLINE CHLORIDE 20 MG/ML IJ SOLN
100.0000 mg | Freq: Once | INTRAMUSCULAR | Status: AC
  Administered 2017-10-26: 100 mg via INTRAVENOUS
  Filled 2017-10-26: qty 5

## 2017-10-26 NOTE — Consult Note (Signed)
[facility] Consult   Reason for Consult:  Capacity evaluation to make medication decisions regarding hip surgery. Referring Physician:  Dr. Erlinda Hong Patient Identification: MATTISEN POHLMANN MRN:  [id number] Principal Diagnosis: Cognitive impairment Diagnosis:   Patient Active Problem List   Diagnosis Date Noted  . Encounter for central line placement [Z45.2]   . S/p left hip fracture [Z87.81]   . Cerebral edema (HCC) [G93.6]   . Hydrocephalus [G91.9]   . ICH (intracerebral hemorrhage) ([place]) [I61.9] 10/25/2017  . SAH (subarachnoid hemorrhage) ([place]) [I60.9] 10/25/2017  . IVH (intraventricular hemorrhage) ([place]) [I61.5] 10/25/2017  . Acute blood loss anemia [D62] 10/25/2017  . Scalp laceration [S01.01XA] 10/25/2017  . 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 AF (atrial fibrillation) ([place]) [I48.91] 05/19/2016  . Chronic anticoagulation [Z79.01] 05/19/2016    Total Time spent with patient: 45 minutes  Subjective:   SARETTA DAHLEM is a 83 y.o. female patient admitted with left hip fracture after GLF with planned upcoming surgery on Friday.  HPI:   According to medical records, patient developed an right MCA infarct after discontinuing Xarelta in anticipation of upcoming hip surgery. She has been partially oriented and able to follow simple commands.   On interview, Ms. Weight reports that she is admitted to the hospital for a left hip fracture. She responds, "I don't know" when asked about what treatment would be done for her hip fracture. When she is told that a surgery is scheduled for Friday she responds, "If that's what it takes." She is only able to report one medical problem (HTN) although she has other documented medical problems.   She denies a psychiatric history. She denies SI, HI or paranoia that people are out to harm her. She occasionally hears sounds at home but does note that she has problems with hearing. She denies problems with sleep or appetite.    Past  Psychiatric History: Denies  Risk to Self: Is patient at risk for suicide?: No Risk to Others:  None. Denies HI. Prior Inpatient Therapy:  Denies  Prior Outpatient Therapy:  Denies   Past Medical History: History reviewed. No pertinent past medical history.  Past Surgical History:  Procedure Laterality Date  . IR PERCUTANEOUS ART THROMBECTOMY/INFUSION INTRACRANIAL INC DIAG ANGIO  10/31/2017  . IR US GUIDE VASC ACCESS RIGHT  11/10/2017   Family History: History reviewed. No pertinent family history. Family Psychiatric  History: Unknown  Social History:  Social History   Substance and Sexual Activity  Alcohol Use Not on file     Social History   Substance and Sexual Activity  Drug Use Not on file    Social History   Socioeconomic History  . Marital status: Unknown    Spouse name: None  . Number of children: None  . Years of education: None  . Highest education level: None  Social Needs  . Financial resource strain: None  . Food insecurity - worry: None  . Food insecurity - inability: None  . Transportation needs - medical: None  . Transportation needs - non-medical: None  Occupational History  . None  Tobacco Use  . Smoking status: Unknown If Ever Smoked  . Smokeless tobacco: Never Used  Substance and Sexual Activity  . Alcohol use: None  . Drug use: None  . Sexual activity: None  Other Topics Concern  . None  Social History Narrative  . None   Additional Social History: She lives at home alone. She has no children and no family in the  area. She attends church and has a good relationship with a few church members.     Allergies:   Allergies  Allergen Reactions  . Penicillins Rash    Labs:  Results for orders placed or performed during the hospital encounter of 11/10/2017 (from the past 48 hour(s))  Glucose, capillary     Status: Abnormal   Collection Time: 11/03/2017  4:43 PM  Result Value Ref Range   Glucose-Capillary 117 (H) 65 - 99 mg/dL  Prepare  cryoprecipitate     Status: None   Collection Time: 11/19/2017  4:46 PM  Result Value Ref Range   Unit Number [id number]    Blood Component Type CRYPOOL THAW    Unit division 00    Status of Unit ISSUED,FINAL    Transfusion Status OK TO TRANSFUSE    Unit Number [id number]    Blood Component Type CRYPOOL THAW    Unit division 00    Status of Unit ISSUED,FINAL    Transfusion Status OK TO TRANSFUSE   Fibrinogen     Status: None   Collection Time: 11/09/2017  4:57 PM  Result Value Ref Range   Fibrinogen 240 210 - 475 mg/dL  CBC     Status: Abnormal   Collection Time: 11/06/2017  9:02 PM  Result Value Ref Range   WBC 7.6 4.0 - 10.5 K/uL   RBC 2.69 (L) 3.87 - 5.11 MIL/uL   Hemoglobin 8.1 (L) 12.0 - 15.0 g/dL   HCT 23.9 (L) 36.0 - 46.0 %   MCV 88.8 78.0 - 100.0 fL   MCH 30.1 26.0 - 34.0 pg   MCHC 33.9 30.0 - 36.0 g/dL   RDW 13.6 11.5 - 15.5 %   Platelets 90 (L) 150 - 400 K/uL    Comment: PLATELET COUNT CONFIRMED BY SMEAR  Protime-INR     Status: Abnormal   Collection Time: 11/11/2017  9:02 PM  Result Value Ref Range   Prothrombin Time 16.9 (H) 11.4 - 15.2 seconds   INR 7.67   Basic metabolic panel     Status: Abnormal   Collection Time: 11/10/2017  9:02 PM  Result Value Ref Range   Sodium 132 (L) 135 - 145 mmol/L   Potassium 3.6 3.5 - 5.1 mmol/L   Chloride 106 101 - 111 mmol/L   CO2 19 (L) 22 - 32 mmol/L   Glucose, Bld 144 (H) 65 - 99 mg/dL   BUN 13 6 - 20 mg/dL   Creatinine, Ser 0.71 0.44 - 1.00 mg/dL   Calcium 7.6 (L) 8.9 - 10.3 mg/dL   GFR calc non Af Amer >60 >60 mL/min   GFR calc Af Amer >60 >60 mL/min    Comment: (NOTE) The eGFR has been calculated using the CKD EPI equation. This calculation has not been validated in all clinical situations. eGFR's persistently <60 mL/min signify possible Chronic Kidney Disease.    Anion gap 7 5 - 15  Magnesium     Status: Abnormal   Collection Time: 11/15/2017  9:02 PM  Result Value Ref Range   Magnesium 1.3 (L) 1.7 - 2.4 mg/dL   Phosphorus     Status: None   Collection Time: 11/03/2017  9:02 PM  Result Value Ref Range   Phosphorus 3.2 2.5 - 4.6 mg/dL  I-STAT 3, arterial blood gas (G3+)     Status: Abnormal   Collection Time: 10/29/2017  9:17 PM  Result Value Ref Range   pH, Arterial 7.366 7.350 - 7.450   pCO2 arterial 34.9  32.0 - 48.0 mmHg   pO2, Arterial 185.0 (H) 83.0 - 108.0 mmHg   Bicarbonate 20.1 20.0 - 28.0 mmol/L   TCO2 21 (L) 22 - 32 mmol/L   O2 Saturation 100.0 %   Acid-base deficit 5.0 (H) 0.0 - 2.0 mmol/L   Patient temperature 97.9 F    Collection site RADIAL, ALLEN'S TEST ACCEPTABLE    Drawn by Operator    Sample type ARTERIAL   Glucose, capillary     Status: Abnormal   Collection Time: 11/04/2017 10:16 PM  Result Value Ref Range   Glucose-Capillary 118 (H) 65 - 99 mg/dL  CBC with Differential/Platelet     Status: Abnormal   Collection Time: 11/17/2017 11:22 PM  Result Value Ref Range   WBC 6.7 4.0 - 10.5 K/uL   RBC 2.94 (L) 3.87 - 5.11 MIL/uL   Hemoglobin 9.0 (L) 12.0 - 15.0 g/dL   HCT 25.7 (L) 36.0 - 46.0 %   MCV 87.4 78.0 - 100.0 fL   MCH 30.6 26.0 - 34.0 pg   MCHC 35.0 30.0 - 36.0 g/dL   RDW 13.7 11.5 - 15.5 %   Platelets 78 (L) 150 - 400 K/uL    Comment: CONSISTENT WITH PREVIOUS RESULT   Neutrophils Relative % 82 %   Neutro Abs 5.5 1.7 - 7.7 K/uL   Lymphocytes Relative 8 %   Lymphs Abs 0.5 (L) 0.7 - 4.0 K/uL   Monocytes Relative 10 %   Monocytes Absolute 0.7 0.1 - 1.0 K/uL   Eosinophils Relative 0 %   Eosinophils Absolute 0.0 0.0 - 0.7 K/uL   Basophils Relative 0 %   Basophils Absolute 0.0 0.0 - 0.1 K/uL  Sodium     Status: Abnormal   Collection Time: 10/25/17  1:36 AM  Result Value Ref Range   Sodium 133 (L) 135 - 145 mmol/L  Lipid panel     Status: None   Collection Time: 10/25/17  5:10 AM  Result Value Ref Range   Cholesterol 108 0 - 200 mg/dL   Triglycerides 79 <150 mg/dL   HDL 41 >40 mg/dL   Total CHOL/HDL Ratio 2.6 RATIO   VLDL 16 0 - 40 mg/dL   LDL Cholesterol 51 0 -  99 mg/dL    Comment:        Total Cholesterol/HDL:CHD Risk Coronary Heart Disease Risk Table                     Men   Women  1/2 Average Risk   3.4   3.3  Average Risk       5.0   4.4  2 X Average Risk   9.6   7.1  3 X Average Risk  23.4   11.0        Use the calculated Patient Ratio above and the CHD Risk Table to determine the patient's CHD Risk.        ATP III CLASSIFICATION (LDL):  <100     mg/dL   Optimal  100-129  mg/dL   Near or Above                    Optimal  130-159  mg/dL   Borderline  160-189  mg/dL   High  >190     mg/dL   Very High   Hemoglobin A1c     Status: Abnormal   Collection Time: 10/25/17  5:10 AM  Result Value Ref Range   Hgb A1c MFr Bld  5.8 (H) 4.8 - 5.6 %    Comment: (NOTE) Pre diabetes:          5.7%-6.4% Diabetes:              >6.4% Glycemic control for   <7.0% adults with diabetes    Mean Plasma Glucose 119.76 mg/dL  CBC with Differential/Platelet     Status: Abnormal   Collection Time: 10/25/17  5:10 AM  Result Value Ref Range   WBC 6.9 4.0 - 10.5 K/uL   RBC 2.88 (L) 3.87 - 5.11 MIL/uL   Hemoglobin 8.6 (L) 12.0 - 15.0 g/dL   HCT 24.9 (L) 36.0 - 46.0 %   MCV 86.5 78.0 - 100.0 fL   MCH 29.9 26.0 - 34.0 pg   MCHC 34.5 30.0 - 36.0 g/dL   RDW 14.2 11.5 - 15.5 %   Platelets 85 (L) 150 - 400 K/uL    Comment: CONSISTENT WITH PREVIOUS RESULT   Neutrophils Relative % 78 %   Neutro Abs 5.4 1.7 - 7.7 K/uL   Lymphocytes Relative 11 %   Lymphs Abs 0.7 0.7 - 4.0 K/uL   Monocytes Relative 11 %   Monocytes Absolute 0.7 0.1 - 1.0 K/uL   Eosinophils Relative 0 %   Eosinophils Absolute 0.0 0.0 - 0.7 K/uL   Basophils Relative 0 %   Basophils Absolute 0.0 0.0 - 0.1 K/uL  Basic metabolic panel     Status: Abnormal   Collection Time: 10/25/17  5:10 AM  Result Value Ref Range   Sodium 136 135 - 145 mmol/L   Potassium 4.1 3.5 - 5.1 mmol/L   Chloride 108 101 - 111 mmol/L   CO2 18 (L) 22 - 32 mmol/L   Glucose, Bld 124 (H) 65 - 99 mg/dL   BUN 13 6 -  20 mg/dL   Creatinine, Ser 0.71 0.44 - 1.00 mg/dL   Calcium 7.8 (L) 8.9 - 10.3 mg/dL   GFR calc non Af Amer >60 >60 mL/min   GFR calc Af Amer >60 >60 mL/min    Comment: (NOTE) The eGFR has been calculated using the CKD EPI equation. This calculation has not been validated in all clinical situations. eGFR's persistently <60 mL/min signify possible Chronic Kidney Disease.    Anion gap 10 5 - 15  Magnesium     Status: Abnormal   Collection Time: 10/25/17  5:10 AM  Result Value Ref Range   Magnesium 2.5 (H) 1.7 - 2.4 mg/dL  Phosphorus     Status: None   Collection Time: 10/25/17  5:10 AM  Result Value Ref Range   Phosphorus 2.7 2.5 - 4.6 mg/dL  Glucose, capillary     Status: Abnormal   Collection Time: 10/25/17  7:25 AM  Result Value Ref Range   Glucose-Capillary 133 (H) 65 - 99 mg/dL   Comment 1 Notify RN    Comment 2 Document in Chart   Sodium     Status: None   Collection Time: 10/25/17  7:58 AM  Result Value Ref Range   Sodium 136 135 - 145 mmol/L  CBC with Differential/Platelet     Status: Abnormal   Collection Time: 10/25/17 10:18 AM  Result Value Ref Range   WBC 6.2 4.0 - 10.5 K/uL   RBC 2.56 (L) 3.87 - 5.11 MIL/uL   Hemoglobin 7.7 (L) 12.0 - 15.0 g/dL   HCT 22.2 (L) 36.0 - 46.0 %   MCV 86.7 78.0 - 100.0 fL   MCH 30.1 26.0 - 34.0 pg  MCHC 34.7 30.0 - 36.0 g/dL   RDW 14.5 11.5 - 15.5 %   Platelets 84 (L) 150 - 400 K/uL    Comment: CONSISTENT WITH PREVIOUS RESULT   Neutrophils Relative % 79 %   Neutro Abs 4.9 1.7 - 7.7 K/uL   Lymphocytes Relative 10 %   Lymphs Abs 0.6 (L) 0.7 - 4.0 K/uL   Monocytes Relative 11 %   Monocytes Absolute 0.7 0.1 - 1.0 K/uL   Eosinophils Relative 0 %   Eosinophils Absolute 0.0 0.0 - 0.7 K/uL   Basophils Relative 0 %   Basophils Absolute 0.0 0.0 - 0.1 K/uL  Glucose, capillary     Status: Abnormal   Collection Time: 10/25/17 11:43 AM  Result Value Ref Range   Glucose-Capillary 122 (H) 65 - 99 mg/dL   Comment 1 Notify RN    Comment  2 Document in Chart   CBC with Differential/Platelet     Status: Abnormal   Collection Time: 10/25/17  3:25 PM  Result Value Ref Range   WBC 6.0 4.0 - 10.5 K/uL   RBC 2.59 (L) 3.87 - 5.11 MIL/uL   Hemoglobin 7.7 (L) 12.0 - 15.0 g/dL   HCT 22.4 (L) 36.0 - 46.0 %   MCV 86.5 78.0 - 100.0 fL   MCH 29.7 26.0 - 34.0 pg   MCHC 34.4 30.0 - 36.0 g/dL   RDW 14.7 11.5 - 15.5 %   Platelets 95 (L) 150 - 400 K/uL    Comment: CONSISTENT WITH PREVIOUS RESULT   Neutrophils Relative % 79 %   Neutro Abs 4.7 1.7 - 7.7 K/uL   Lymphocytes Relative 10 %   Lymphs Abs 0.6 (L) 0.7 - 4.0 K/uL   Monocytes Relative 11 %   Monocytes Absolute 0.6 0.1 - 1.0 K/uL   Eosinophils Relative 1 %   Eosinophils Absolute 0.0 0.0 - 0.7 K/uL   Basophils Relative 0 %   Basophils Absolute 0.0 0.0 - 0.1 K/uL  Sodium     Status: None   Collection Time: 10/25/17  3:25 PM  Result Value Ref Range   Sodium 141 135 - 145 mmol/L  Glucose, capillary     Status: Abnormal   Collection Time: 10/25/17  4:50 PM  Result Value Ref Range   Glucose-Capillary 137 (H) 65 - 99 mg/dL  Sodium     Status: None   Collection Time: 10/25/17  7:38 PM  Result Value Ref Range   Sodium 143 135 - 145 mmol/L  Glucose, capillary     Status: Abnormal   Collection Time: 10/25/17  8:19 PM  Result Value Ref Range   Glucose-Capillary 118 (H) 65 - 99 mg/dL  CBC with Differential/Platelet     Status: Abnormal   Collection Time: 10/25/17  9:58 PM  Result Value Ref Range   WBC 5.7 4.0 - 10.5 K/uL   RBC 2.52 (L) 3.87 - 5.11 MIL/uL   Hemoglobin 7.6 (L) 12.0 - 15.0 g/dL   HCT 22.0 (L) 36.0 - 46.0 %   MCV 87.3 78.0 - 100.0 fL   MCH 30.2 26.0 - 34.0 pg   MCHC 34.5 30.0 - 36.0 g/dL   RDW 14.9 11.5 - 15.5 %   Platelets 96 (L) 150 - 400 K/uL    Comment: CONSISTENT WITH PREVIOUS RESULT   Neutrophils Relative % 74 %   Neutro Abs 4.2 1.7 - 7.7 K/uL   Lymphocytes Relative 10 %   Lymphs Abs 0.6 (L) 0.7 - 4.0 K/uL   Monocytes  Relative 14 %   Monocytes Absolute  0.8 0.1 - 1.0 K/uL   Eosinophils Relative 1 %   Eosinophils Absolute 0.1 0.0 - 0.7 K/uL   Basophils Relative 0 %   Basophils Absolute 0.0 0.0 - 0.1 K/uL  Magnesium     Status: None   Collection Time: 10/26/17  1:33 AM  Result Value Ref Range   Magnesium 2.0 1.7 - 2.4 mg/dL  Phosphorus     Status: Abnormal   Collection Time: 10/26/17  1:33 AM  Result Value Ref Range   Phosphorus 1.6 (L) 2.5 - 4.6 mg/dL  Basic metabolic panel     Status: Abnormal   Collection Time: 10/26/17  1:33 AM  Result Value Ref Range   Sodium 146 (H) 135 - 145 mmol/L   Potassium 3.3 (L) 3.5 - 5.1 mmol/L    Comment: DELTA CHECK NOTED   Chloride 123 (H) 101 - 111 mmol/L   CO2 20 (L) 22 - 32 mmol/L   Glucose, Bld 129 (H) 65 - 99 mg/dL   BUN 15 6 - 20 mg/dL   Creatinine, Ser 0.69 0.44 - 1.00 mg/dL   Calcium 8.0 (L) 8.9 - 10.3 mg/dL   GFR calc non Af Amer >60 >60 mL/min   GFR calc Af Amer >60 >60 mL/min    Comment: (NOTE) The eGFR has been calculated using the CKD EPI equation. This calculation has not been validated in all clinical situations. eGFR's persistently <60 mL/min signify possible Chronic Kidney Disease.    Anion gap 3 (L) 5 - 15  CBC with Differential/Platelet     Status: Abnormal   Collection Time: 10/26/17  1:33 AM  Result Value Ref Range   WBC 5.0 4.0 - 10.5 K/uL   RBC 2.47 (L) 3.87 - 5.11 MIL/uL   Hemoglobin 7.4 (L) 12.0 - 15.0 g/dL   HCT 21.7 (L) 36.0 - 46.0 %   MCV 87.9 78.0 - 100.0 fL   MCH 30.0 26.0 - 34.0 pg   MCHC 34.1 30.0 - 36.0 g/dL   RDW 14.8 11.5 - 15.5 %   Platelets 102 (L) 150 - 400 K/uL    Comment: CONSISTENT WITH PREVIOUS RESULT   Neutrophils Relative % 72 %   Neutro Abs 3.6 1.7 - 7.7 K/uL   Lymphocytes Relative 13 %   Lymphs Abs 0.7 0.7 - 4.0 K/uL   Monocytes Relative 13 %   Monocytes Absolute 0.6 0.1 - 1.0 K/uL   Eosinophils Relative 2 %   Eosinophils Absolute 0.1 0.0 - 0.7 K/uL   Basophils Relative 0 %   Basophils Absolute 0.0 0.0 - 0.1 K/uL  Protime-INR      Status: Abnormal   Collection Time: 10/26/17  1:33 AM  Result Value Ref Range   Prothrombin Time 15.6 (H) 11.4 - 15.2 seconds   INR 1.25   Sodium     Status: Abnormal   Collection Time: 10/26/17  6:46 AM  Result Value Ref Range   Sodium 151 (H) 135 - 145 mmol/L  Glucose, capillary     Status: Abnormal   Collection Time: 10/26/17  7:34 AM  Result Value Ref Range   Glucose-Capillary 114 (H) 65 - 99 mg/dL   Comment 1 Notify RN    Comment 2 Document in Chart   Glucose, capillary     Status: Abnormal   Collection Time: 10/26/17 11:06 AM  Result Value Ref Range   Glucose-Capillary 136 (H) 65 - 99 mg/dL   Comment 1 Notify RN  Comment 2 Document in Chart   Sodium     Status: Abnormal   Collection Time: 10/26/17 12:54 PM  Result Value Ref Range   Sodium 151 (H) 135 - 145 mmol/L    Current Facility-Administered Medications  Medication Dose Route Frequency Provider Last Rate Last Dose  .  stroke: mapping our early stages of recovery book   Does not apply Once Mary Sella, NP      . acetaminophen (TYLENOL) tablet 650 mg  650 mg Oral Q4H PRN Costello, Mary A, NP       Or  . acetaminophen (TYLENOL) solution 650 mg  650 mg Per Tube Q4H PRN Costello, Mary A, NP       Or  . acetaminophen (TYLENOL) suppository 650 mg  650 mg Rectal Q4H PRN Mary Sella, NP   650 mg at 10/25/17 1640  . chlorhexidine (PERIDEX) 0.12 % solution 15 mL  15 mL Mouth Rinse BID Rosalin Hawking, MD   15 mL at 10/26/17 5009  . Chlorhexidine Gluconate Cloth 2 % PADS 6 each  6 each Topical Q0600 Rosalin Hawking, MD   6 each at 10/25/17 1719  . clevidipine (CLEVIPREX) infusion 0.5 mg/mL  0-21 mg/hr Intravenous Continuous Rosalin Hawking, MD      . fentaNYL (SUBLIMAZE) injection 25 mcg  25 mcg Intravenous Q2H PRN Sampson Goon, MD   25 mcg at 10/26/17 1221  . insulin aspart (novoLOG) injection 0-9 Units  0-9 Units Subcutaneous TID WC Mary Sella, NP   1 Units at 10/26/17 1229  . labetalol (NORMODYNE,TRANDATE) injection  10 mg  10 mg Intravenous Q10 min PRN Rosalin Hawking, MD   10 mg at 10/26/17 0928  . MEDLINE mouth rinse  15 mL Mouth Rinse q12n4p Rosalin Hawking, MD   15 mL at 10/26/17 1213  . neomycin-bacitracin-polymyxin (NEOSPORIN) ointment   Topical Daily Rosalin Hawking, MD      . pantoprazole (PROTONIX) injection 40 mg  40 mg Intravenous QHS Candise Che A, NP   40 mg at 10/25/17 2158  . potassium PHOSPHATE 14 mmol in dextrose 5 % 250 mL infusion  14 mmol Intravenous Once Sampson Goon, MD 42 mL/hr at 10/26/17 1230 14 mmol at 10/26/17 1230  . senna-docusate (Senokot-S) tablet 1 tablet  1 tablet Oral QHS PRN Costello, Mary A, NP      . sodium chloride (hypertonic) 3 % solution   Intravenous Continuous Greta Doom, MD 50 mL/hr at 10/26/17 0700      Musculoskeletal: Strength & Muscle Tone: Patient lethargic in bed so UTA.  Gait & Station: UTA since patient was lying in bed.  Patient leans: N/A  Psychiatric Specialty Exam: Physical Exam  Nursing note and vitals reviewed. Constitutional: She appears well-developed and well-nourished.  HENT:  Head: Normocephalic and atraumatic.  Neck: Normal range of motion.  Respiratory: Effort normal.  Musculoskeletal: Normal range of motion.  Neurological: She is alert.  Oriented to person and place.   Skin: No rash noted.  Psychiatric: Her behavior is normal. Thought content normal.    Review of Systems  Respiratory: Negative for shortness of breath.   Gastrointestinal: Negative for diarrhea, nausea and vomiting.  Psychiatric/Behavioral: Positive for hallucinations (Reports occasionally hearing sounds at home.). Negative for depression, substance abuse and suicidal ideas. The patient is not nervous/anxious and does not have insomnia.     Blood pressure 126/66, pulse 90, temperature 97.6 F (36.4 C), temperature source Axillary, resp. rate 12, height 5' 7"  (1.702 m),  weight 69.2 kg (152 lb 8.9 oz), SpO2 100 %.Body mass index is 23.89 kg/m.  General  Appearance: Well Groomed and elderly Caucasian female who is frail, has thinning hair, NG tube placement, facial droop and lying in bed. NAD.  Eye Contact:  Good  Speech:  Slow and Slurred  Volume:  Decreased and muffled.   Mood:  Dysphoric and but likely secondary to lethargy.   Affect:  Constricted  Thought Process:  Goal Directed and Linear  Orientation:  Other:  Oriented to person and place.  Thought Content:  Logical  Suicidal Thoughts:  No  Homicidal Thoughts:  No  Memory:  Immediate;   Poor Recent;   Poor Remote;   Poor  Judgement:  Poor  Insight:  Fair  Psychomotor Activity:  Decreased and Psychomotor Retardation likely secondary to stroke.   Concentration:  Concentration: Fair and Attention Span: Fair  Recall:  Poor  Fund of Knowledge:  Poor  Language:  Fair  Akathisia:  No  Handed:  Right  AIMS (if indicated):   N/A  Assets:  Financial Resources/Insurance Housing  ADL's:  Impaired  Cognition:  Impaired,  Mild  Sleep:   Okay   Assessment: [place] is a 83 y.o. female who was admitted with left hip fracture after GLF with planned upcoming surgery on Friday. She does not demonstrate capacity to make medical decisions related to hip surgery due to her lack of understanding of the nature of her condition, treatment options and the risk verus benefits as well as alternative options related to hip surgery. She will require an authorized representative to help her make medical decisions regarding her surgery.    Treatment Plan Summary: -Recommend assigning an authorized representative to help patient make medical decisions regarding hip surgery since she does not demonstrate capacity to make her own decisions.  -Will sign off on patient at this time. Please consult psychiatry again if needed.  Disposition: No evidence of imminent risk to self or others at present.   Patient does not meet criteria for psychiatric inpatient admission.  Faythe Dingwall, DO 10/26/2017  3:27 PM

## 2017-10-26 NOTE — Evaluation (Signed)
Speech Language Pathology Evaluation Patient Details Name: Carol Parker MRN: [id number] DOB: 1929/05/07 Today's Date: 10/26/2017 Time: [id number] SLP Time Calculation (min) (ACUTE ONLY): 10 min  Problem List:  Patient Active Problem List   Diagnosis Date Noted  . Encounter for central line placement   . S/p left hip fracture   . Cerebral edema (HCC)   . Hydrocephalus   . ICH (intracerebral hemorrhage) (HCC) 10/25/2017  . SAH (subarachnoid hemorrhage) (HCC) 10/25/2017  . IVH (intraventricular hemorrhage) (HCC) 10/25/2017  . Acute blood loss anemia 10/25/2017  . Scalp laceration 10/25/2017  . Stroke (cerebrum) (HCC) 07-19-17  . AF (atrial fibrillation) (HCC) 05/19/2016  . Chronic anticoagulation 05/19/2016   Past Medical History: History reviewed. No pertinent past medical history. Past Surgical History:  Past Surgical History:  Procedure Laterality Date  . IR PERCUTANEOUS ART THROMBECTOMY/INFUSION INTRACRANIAL INC DIAG ANGIO  11/15/2017  . IR US GUIDE VASC ACCESS RIGHT  11/12/2017   HPI:  Pt is an 83 y.o.female [facility]p fall with L hip fx. Pt was taken off her Xarelto pending surgical repair but developed L sided weakness, facial droop, and AMS. Pt had a R MCA infarct s/p tPA resulting in small R frontal and occipital SAD. She also underwent IR mechanical thrombectomy but causing R frontal, temporal, and occiptal large hematomas with SAH and IVH. PMH includes HTN, DM< a fib, and UTI.   Assessment / Plan / Recommendation Clinical Impression  Pt is drowsy and only partially oriented to situation. She follows simple, functional commands well with delayed response times, but has limited sustained attention, functional problem solving, and recall. Pt demonstrated intellectual awareness of her dysarthric speech, but not her physical impairments. She has a R sided gaze preference but does attend some to her left. Pt would need intensive SLP f/u to maximize  functional cognition and communication.    SLP Assessment  SLP Recommendation/Assessment: Patient needs continued Speech Lanaguage Pathology Services SLP Visit Diagnosis: Dysarthria and anarthria (R47.1);Cognitive communication deficit (R41.841)    Follow Up Recommendations  Skilled Nursing facility    Frequency and Duration min 2x/week  2 weeks      SLP Evaluation Cognition  Overall Cognitive Status: No family/caregiver present to determine baseline cognitive functioning Arousal/Alertness: Lethargic Orientation Level: Oriented to person;Oriented to place;Disoriented to situation(partially oriented to situation) Attention: Sustained Sustained Attention: Impaired Sustained Attention Impairment: Functional basic Memory: Impaired Memory Impairment: Decreased recall of new information Awareness: Impaired Awareness Impairment: Intellectual impairment;Emergent impairment Problem Solving: Impaired Problem Solving Impairment: Functional basic Safety/Judgment: Impaired       Comprehension  Auditory Comprehension Overall Auditory Comprehension: Appears within functional limits for tasks assessed(follow simple, functional instructions )    Expression Expression Primary Mode of Expression: Verbal Verbal Expression Overall Verbal Expression: Appears within functional limits for tasks assessed   Oral / Motor  Motor Speech Overall Motor Speech: Impaired Respiration: Within functional limits Phonation: Low vocal intensity;Wet Articulation: Impaired Level of Impairment: Conversation Intelligibility: Intelligibility reduced Conversation: 50-74% accurate Motor Planning: Witnin functional limits   GO                    Carol Parker, Mava Suares 10/26/2017, 12:11 PM  Carol HamLaura Parker, M.A. CCC-SLP [phone]

## 2017-10-26 NOTE — Progress Notes (Signed)
Patient ID: Carol BrasilSusie M Parker, female   DOB: 07-Jan-1929, 83 y.o.   MRN: [id number]   LOS: 2 days   Subjective: Mental status much improved today, answering questions. Agreed when I said we needed to fix her hip.   Objective: Vital signs in last 24 hours: Temp:  [97.6 F (36.4 C)-98.4 F (36.9 C)] 97.6 F (36.4 C) (11/06 0800) Pulse Rate:  [68-96] 87 (11/06 0715) Resp:  [11-30] 16 (11/06 0715) BP: (103-167)/(47-98) 136/66 (11/06 0715) SpO2:  [96 %-100 %] 99 % (11/06 0715) Arterial Line BP: (103-164)/(41-64) 137/56 (11/05 2245) Weight:  [69.2 kg (152 lb 8.9 oz)] 69.2 kg (152 lb 8.9 oz) (11/05 1100) Last BM Date: (pta)   Laboratory  CBC Recent Labs    10/25/17 2158 10/26/17 0133  WBC 5.7 5.0  HGB 7.6* 7.4*  HCT 22.0* 21.7*  PLT 96* 102*   BMET Recent Labs    10/25/17 0510  10/26/17 0133 10/26/17 0646  NA 136   < > 146* 151*  K 4.1  --  3.3*  --   CL 108  --  123*  --   CO2 18*  --  20*  --   GLUCOSE 124*  --  129*  --   BUN 13  --  15  --   CREATININE 0.71  --  0.69  --   CALCIUM 7.8*  --  8.0*  --    < > = values in this interval not displayed.     Physical Exam General appearance: no distress  BLE: 2+ DP, 1+ pitting edema   Assessment/Plan: Left hip fx -- Plan on fixation Friday by Dr. Aundria Rudogers to give time for medical stabilization by primary team.    Freeman CaldronMichael J. Kataleya Zaugg, PA-C Orthopedic Surgery [phone] 10/26/2017

## 2017-10-26 NOTE — Procedures (Signed)
Endotracheal Intubation Procedure Note Indication for endotracheal intubation: airway compromise and impending respiratory failure Sedation: etomidate and midazolam Paralytic: succinylcholine Equipment: Macintosh 4 laryngoscope blade and 7.325mm cuffed endotracheal tube Cricoid Pressure: no Number of attempts:1 ETT location confirmed by by auscultation, by CXR and ETCO2 monitor.  Patient developed worsening hypoxia requiring nonrebreather. CXR showed new LLL pneumonia. Staff report patient had been having difficulty managing her own secretions earlier today. She has been strict NPO. On my exam she was somnolent but arousable to voice, very weakly nodded that she understood the plan for her care. Given her worsening hypoxia and difficulty managing her secretions with severe weakness, decision made to intubate. Patient tolerated procedure well with no complications. Copious creamy white/yellow secretions in posterior oropharynx were suctioned. Also visualized the same creamy secretions between her vocal cords and into her trachea. Following intubation and stabilization will obtain sputum culture and start antibiotics for aspiration pneumonia.

## 2017-10-26 NOTE — Progress Notes (Signed)
Pt satting 88-92 on [place], mouth wide open and dry. Changed to NRB, RT called to assist.

## 2017-10-26 NOTE — Progress Notes (Signed)
Pt had an increase in HR to A Fib in 160s. Fentanyl and labetelol given. HR 86, AF now, cont to monitor.

## 2017-10-26 NOTE — Procedures (Signed)
Intubation Procedure Note Carol Parker [id number] 14-Mar-1929  Procedure: Intubation Indications: Airway protection and maintenance  Procedure Details Consent: Unable to obtain consent because of altered level of consciousness. Time Out: Verified patient identification, verified procedure, site/side was marked, verified correct patient position, special equipment/implants available, medications/allergies/relevent history reviewed, required imaging and test results available.  2210  Maximum sterile technique was used including antiseptics, cap, gloves, gown and hand hygiene.  MAC and 4    Evaluation Hemodynamic Status: BP stable throughout; O2 sats: stable throughout Patient's Current Condition: stable Complications: No apparent complications Patient did tolerate procedure well. Chest X-ray ordered to verify placement.  CXR: tube position acceptable.   Milinda Cave 10/26/2017

## 2017-10-26 NOTE — Progress Notes (Signed)
Pharmacy Antibiotic Note  Carol BrasilSusie M Parker is a 83 y.o. female admitted on 11/06/2017 with HCAP.  Pharmacy has been consulted for aztreonam and levofloxacin dosing.  Patient has a penicillin allergy, with a reaction documented as hives. No previous records of cephalosporins or penicillins. She was also intubated due to worsening hypoxia with copious white/yellow secretions noted. WBC is within normal limits and has been afebrile. Renal function is stable (SCr 0.69; CrCl ~49 mL/min).  Plan: Start aztreonam 2 gram every 8 hours  Start levofloxacin 500 mg every 24 hours Monitor cx results, clinical picture, renal function  Height: 5\' 7"  (170.2 cm) Weight: 152 lb 8.9 oz (69.2 kg) IBW/kg (Calculated) : 61.6  Temp (24hrs), Avg:97.9 F (36.6 C), Min:97.6 F (36.4 C), Max:98.2 F (36.8 C)  Recent Labs  Lab 11/13/2017 2102  10/25/17 0510 10/25/17 1018 10/25/17 1525 10/25/17 2158 10/26/17 0133  WBC 7.6   < > 6.9 6.2 6.0 5.7 5.0  CREATININE 0.71  --  0.71  --   --   --  0.69   < > = values in this interval not displayed.    Estimated Creatinine Clearance: 49.1 mL/min (by C-G formula based on SCr of 0.69 mg/dL).    Allergies  Allergen Reactions  . Penicillins Rash    Antimicrobials this admission: Aztreonam 11/6 >>  Levofloxacin 11/6 >>   Dose adjustments this admission: N/A  Microbiology results:  11/6 MRSA PCR: ordered  Thank you for allowing pharmacy to be a part of this patient's care.  Girard CooterKimberly Perkins, PharmD Clinical Pharmacist  Pager: [phone] Phone: [phone] 10/26/2017 10:41 PM

## 2017-10-26 NOTE — Progress Notes (Signed)
eLink Physician-Brief Progress Note Patient Name: Loretha BrasilSusie M Blue DOB: 12-Apr-1929 MRN: [id number]   Date of Service  10/26/2017  HPI/Events of Note  Asked by Rn to camera in room due to dyspnea.  Noted history of right MCA infarct, s/p IR with SAH, IVH, and hematoma on the right hemisphere. On cam check pt is lethargic, moist, weak cough. Pt currently NPO. Most recent CXR unremarkable.  Currently afebrile, sat 98% on 3L [place], RR=25. Appears uncomfortable, fidgety, but not in resp distress.    eICU Interventions  Pt is at high risk of aspiration and respiratory decline. Given aspiration risk I would not add anxiolytics or sedatives at this time.  Start ipratropium nebs.  Continue oral care start  NT suctioning.         Shane Crutchradeep Crystallynn Noorani 10/26/2017, 8:12 PM

## 2017-10-26 NOTE — Progress Notes (Signed)
PT Cancellation Note  Patient Details Name: Carol BrasilSusie M Parker MRN: [id number] DOB: [birth year]   Cancelled Treatment:    Reason Eval/Treat Not Completed: Medical issues which prohibited therapy(pt remains on strict bedrest awaiting hip sx)   Oree Hislop B Perrin Gens 10/26/2017, 7:30 AM  Delaney MeigsMaija Tabor Nikolaj Geraghty, PT [phone]

## 2017-10-26 NOTE — Anesthesia Postprocedure Evaluation (Signed)
Anesthesia Post Note  Patient: Loretha BrasilSusie M Awbrey  Procedure(s) Performed: RADIOLOGY WITH ANESTHESIA (N/A )     Patient location during evaluation: ICU Anesthesia Type: General Level of consciousness: awake and patient cooperative Pain management: pain level controlled Vital Signs Assessment: post-procedure vital signs reviewed and stable Respiratory status: spontaneous breathing, nonlabored ventilation, respiratory function stable and patient connected to nasal cannula oxygen Cardiovascular status: blood pressure returned to baseline and stable Postop Assessment: no apparent nausea or vomiting Anesthetic complications: no    Last Vitals:  Vitals:   10/26/17 0800 10/26/17 0900  BP: (!) 124/51 139/71  Pulse: 72 72  Resp: 12 18  Temp: 36.4 C   SpO2: 97% 100%    Last Pain:  Vitals:   10/26/17 0800  TempSrc: Axillary  PainSc:                  Baldomero Mirarchi

## 2017-10-26 NOTE — Progress Notes (Signed)
Referring Physician(s): Dr Jerel Shepherd  Supervising Physician: Gilmer Mor  Patient Status:  [facility] - In-pt  Chief Complaint:  R MCA revascularization 11/4  Subjective:  CVA Some better today Moving Rt to command Moves Left foot slightly Does try to answer to me  Allergies: Penicillins  Medications: Prior to Admission medications   Medication Sig Start Date End Date Taking? Authorizing Provider  albuterol (PROVENTIL HFA;VENTOLIN HFA) 108 (90 Base) MCG/ACT inhaler Inhale 2 puffs every 4 (four) hours as needed into the lungs for wheezing or shortness of breath.    Yes [provider]  calcium carbonate (CALCIUM 600) 1500 (600 Ca) MG TABS tablet Take 600 mg of elemental calcium 2 (two) times daily with a meal by mouth.   Yes [provider]  Cyanocobalamin (VITAMIN B 12 PO) Take 2,500 mcg daily by mouth.   Yes [provider]  losartan (COZAAR) 50 MG tablet Take 50 mg daily by mouth.   Yes [provider]  metFORMIN (GLUCOPHAGE-XR) 500 MG 24 hr tablet Take 500 mg daily by mouth.   Yes [provider]  metoprolol succinate (TOPROL-XL) 50 MG 24 hr tablet Take 50 mg daily by mouth. Take with or immediately following a meal.   Yes [provider]  Rivaroxaban (XARELTO) 15 MG TABS tablet Take 15 mg daily by mouth.    Yes [provider]  terbinafine (LAMISIL) 1 % cream Apply 1 application topically 2 (two) times daily. 02/04/17  Yes Stover, Titorya, DPM  terbinafine (LAMISIL) 250 MG tablet Take 250 mg every other day by mouth.   Yes [provider]  triamcinolone cream (KENALOG) 0.5 % Apply 1 application See admin instructions topically. Filled 07/19/17: apply topically to feet three times daily for 3 weeks, stop for 1 week, then repeat process   Yes [provider]  lidocaine (XYLOCAINE) 5 % ointment Apply 1 application topically 4 (four) times daily as needed. Patient not taking: Reported on 10/25/2017 12/02/16    Asencion Islam, DPM     Vital Signs: BP 136/66   Pulse 87   Temp 97.9 F (36.6 C) (Oral)   Resp 16   Ht 5\' 7"  (1.702 m)   Wt 152 lb 8.9 oz (69.2 kg)   SpO2 99%   BMI 23.89 kg/m   Physical Exam  Pulmonary/Chest: Effort normal.  Abdominal: Soft.  Musculoskeletal:  Right moves to command- upper and lower extr Left foot moves slightly No movement of left hand seen Can acknowledge me with "hello"  Complaining of right hip pain  Nursing note and vitals reviewed.   Imaging: Ct Head Wo Contrast  Result Date: 10/25/2017 CLINICAL DATA:  Altered level of consciousness. History of RIGHT ICA occlusion, mechanical thrombectomy of the M2 occlusion. EXAM: CT HEAD WITHOUT CONTRAST TECHNIQUE: Contiguous axial images were obtained from the base of the skull through the vertex without intravenous contrast. COMPARISON:  Multiple CT 11/19/2017 back or FINDINGS: BRAIN: Evolving RIGHT frontal lobe intraparenchymal hematoma with marginal a hazy density. Smaller RIGHT basal ganglia hemorrhage, less conspicuous than prior CT. Small similar RIGHT occipital lobe hemorrhage. Smaller RIGHT temporal lobe indistinct hemorrhage. Subcentimeter LEFT insular hemorrhage versus subarachnoid blood products. Increased intraventricular blood products. 5 mm RIGHT to LEFT midline shift, decreased from 7 mm. Mild similar LEFT ventricular entrapment. Subarachnoid hemorrhage RIGHT basal cistern. Mild RIGHT uncal herniation. VASCULAR: Mild calcific atherosclerosis of the carotid siphons. SKULL: No skull fracture. Moderate LEFT parietoccipital scalp hematoma. SINUSES/ORBITS: Severe chronic pan paranasal sinusitis.  Mastoid air cells are well aerated.The included ocular globes and orbital contents are non-suspicious. OTHER: None. IMPRESSION: 1. Multifocal intraparenchymal hemorrhage, some of which appear smaller most compatible with a component of resolving contrast staining. 2. Increasing intraventricular blood products with  mild LEFT ventricle entrapment. 5 mm RIGHT to LEFT midline shift, decreased from 7 mm. 3. Small volume scattered subarachnoid hemorrhage. 4. Moderate LEFT parietoccipital scalp hematoma. Electronically Signed   By: Awilda Metro M.D.   On: 10/25/2017 02:03   Ct Head Wo Contrast  Result Date: 10/23/2017 CLINICAL DATA:  Status post revascularization of the right middle cerebral artery. EXAM: CT HEAD WITHOUT CONTRAST TECHNIQUE: Contiguous axial images were obtained from the base of the skull through the vertex without intravenous contrast. COMPARISON:  CT head without contrast from the same day at 11:50 a.m. FINDINGS: Brain: There significant increase in size and number of multiple foci of parenchymal hemorrhage. The largest is in the right frontal lobe, measuring 3.6 x 3.8 x 6.0 cm. There is hemorrhage into the right basal ganglia and anterior limb internal capsule. Right occipital lobe hemorrhages noted. Hemorrhage in the medial right temporal lobe measures 3.4 x 2.5 x 4.8 cm. Diffuse subarachnoid hemorrhage is present involving the suprasellar cisterns and along the tentorium. Minimal blood is noted within the ventricles, posteriorly on the left. There is some dilation of the left temporal tip suggesting early hydrocephalus. The para mesencephalic cisterns are opacified. Right-to-left midline shift measures 6.5 mm. Vascular: Atherosclerotic calcifications are present within the cavernous internal carotid artery is bilaterally. There is no residual hyperdense vessel. Skull: Calvarium is intact. Sinuses/Orbits: Chronic sinus disease is again seen. Bilateral lens replacements are noted. IMPRESSION: 1. Multiple sites of parenchymal hemorrhage involving the right frontal lobe, right basal ganglia, and right temporal lobe. 2. Progressive hemorrhage in the right occipital lobe. 3. Extensive effacement of the sulci over the right hemispheric and 6 mm of right-to-left midline shift. 4. Diffuse subarachnoid hemorrhage  is now present. 5. Intraventricular hemorrhages noted with early dilation of the left temporal tip. Critical Value/emergent results were called by telephone at the time of interpretation on 10/27/2017 at 4:36 pm to Dr. Gilmer Mor , who verbally acknowledged these results. Electronically Signed   By: Marin Roberts M.D.   On: 10/30/2017 16:38   Ct Head Wo Contrast  Result Date: 11/10/2017 CLINICAL DATA:  83 year old female transferred from [facility] for emergent large vessel occlusion, right ICA terminus and right MCA. Now status post IV tPA, and transfer to [facility]. EXAM: CT HEAD WITHOUT CONTRAST TECHNIQUE: Contiguous axial images were obtained from the base of the skull through the vertex without intravenous contrast. COMPARISON:  CTA head and neck and noncontrast head CT [facility] 0903 hours today. FINDINGS: Brain: Along the medial aspect of the right lentiform hypodensity seen earlier today there is now crescent-shaped and other scattered abnormal hyperdensity versus enhancement. There is more curvilinear abnormal hyperdensity or enhancement in the left inferior frontal gyrus (series 3, image 16). There is more confluent hyperdensity or enhancement in the mesial right temporal lobe (image 14) and also new curvilinear hyperdensity also at the right occipital pole (same image). There is also trace curvilinear hyperdensity along the more superior right frontal convexity (image 22). No associated mass effect. No intraventricular hemorrhage. No other extra-axial hemorrhage. No new areas of cytotoxic edema identified. Left hemisphere and posterior fossa gray-white matter differentiation is stable. Vascular: Residual intravascular contrast is present. Calcified atherosclerosis at the skull base. Skull: No acute osseous  abnormality identified. Sinuses/Orbits: Stable chronic sinusitis findings. Tympanic cavities and mastoids remain clear. Other: The patient has a broad-based left  posterior convexity scalp hematoma measuring up to 7 mm in thickness which is new or increased from the CTA head earlier today (series 4, image 35). The underlying calvarium is intact. Other scalp soft tissues appear stable and within normal limits. No acute orbit soft tissue findings. IMPRESSION: 1. Small new multifocal areas of abnormal hyperdensity in the right hemisphere. Although some of these in the right MCA territory might reflect post ischemic contrast staining, there is at least a small volume of acute hemorrhage, including that seen at the right occipital pole which is probably in the subarachnoid space. This was discussed in person with Dr. Milon Dikes on 03-Nov-2017 at 1200 hours. 2. No intracranial mass effect. No new areas of cytotoxic edema in the right MCA territory. 3. New or increased left posterior scalp hematoma since 0902 hours today, 7 mm in thickness. No underlying skull fracture. Electronically Signed   By: Odessa Fleming M.D.   On: 11/03/2017 12:44   Mr Maxine Glenn Head Wo Contrast  Result Date: 10/25/2017 CLINICAL DATA:  83 y/o  F; stroke follow-up. EXAM: MRI HEAD WITHOUT CONTRAST MRA HEAD WITHOUT CONTRAST TECHNIQUE: Multiplanar, multiecho pulse sequences of the brain and surrounding structures were obtained without intravenous contrast. Angiographic images of the head were obtained using MRA technique without contrast. COMPARISON:  10/25/2017 CT head. FINDINGS: MRI HEAD FINDINGS Brain: Multiple acute parenchymal hemorrhage with intermediate T1 and hypo/intermediate T2 signal. Large hematoma within the right anterior temporal lobe and the right frontal lobe are grossly stable from prior CT of head given differences in technique. Hemorrhage within the caudothalamic groove and the right putamen are stable to mildly increased in size. Surrounding T2 FLAIR signal abnormality is compatible with vasogenic edema and resultant mass effect partially effaces the right lateral ventricle and results in 5 mm right  to left midline shift when measured in a similar fashion (series 7, image 18). Subarachnoid hemorrhage is present predominantly within the sylvian fissures and overlying the cerebellum and there is intraventricular hemorrhage pooling within occipital horns and extending into the right lateral ventricle. Ventricle size is stable. No downward herniation. Area of reduced diffusion within the right lentiform nucleus extending into caudate body and corona radiata compatible with acute infarction. There are several additional small cortical infarctions within the right MCA distribution. Vascular: As below. Skull and upper cervical spine: Normal marrow signal. Sinuses/Orbits: No abnormal signal of mastoid air cells. Bilateral intra-ocular lens replacement. Extensive paranasal sinus disease the right ethmoid and maxillary sinus opacification. Other: None. MRA HEAD FINDINGS Internal carotid arteries: Right cavernous ICA medially directed aneurysm measuring 2 mm to dome with 4 mm base (series 4, image 103). Anterior cerebral arteries:  Patent. Middle cerebral arteries: Patent left M1 and distal circulation. Patent right M1 and proximal M2 branches. Several M2 branches extending to the right anterolateral frontal region appear occluded, the area is partially obscured by artifact from motion and the presence of hemorrhage within the MCA cistern. Anterior communicating artery: Patent. Posterior communicating arteries:  Probable diminutive bilaterally. Posterior cerebral arteries:  Patent. Basilar artery:  Patent. Vertebral arteries:  Patent. IMPRESSION: MRI head: 1. Stable large acute hemorrhage within right anterior temporal lobe and right frontal lobe. 2. Acute hemorrhage within right caudothalamic groove and right putamen are stable mildly increased in size given differences in technique. 3. Subarachnoid hemorrhage predominantly within sylvian fissures and right MCA cistern. 4. Intraventricular hemorrhage  within occipital  horns and right temporal horn of lateral ventricle. Stable ventricle size. 5. Acute infarct of right basal ganglia involving putamen and caudate body. Additional scattered acute cortical infarcts in right MCA distribution. 6. Stable mass effect with partial effacement of right lateral ventricle and 5 mm right-to-left midline shift. MRA head: 1. Several M2 branches extending to the right anterolateral frontal region appear occluded, the area is partially obscured by artifact from motion and the presence of hemorrhage within the MCA cistern. 2. Right cavernous ICA medially directed aneurysm measuring 2 mm to dome with 4 mm base. 3. Otherwise no large vessel occlusion, aneurysm, or significant stenosis is identified. Electronically Signed   By: Mitzi Hansen M.D.   On: 10/25/2017 14:23   Mr Brain Wo Contrast  Result Date: 10/25/2017 CLINICAL DATA:  83 y/o  F; stroke follow-up. EXAM: MRI HEAD WITHOUT CONTRAST MRA HEAD WITHOUT CONTRAST TECHNIQUE: Multiplanar, multiecho pulse sequences of the brain and surrounding structures were obtained without intravenous contrast. Angiographic images of the head were obtained using MRA technique without contrast. COMPARISON:  10/25/2017 CT head. FINDINGS: MRI HEAD FINDINGS Brain: Multiple acute parenchymal hemorrhage with intermediate T1 and hypo/intermediate T2 signal. Large hematoma within the right anterior temporal lobe and the right frontal lobe are grossly stable from prior CT of head given differences in technique. Hemorrhage within the caudothalamic groove and the right putamen are stable to mildly increased in size. Surrounding T2 FLAIR signal abnormality is compatible with vasogenic edema and resultant mass effect partially effaces the right lateral ventricle and results in 5 mm right to left midline shift when measured in a similar fashion (series 7, image 18). Subarachnoid hemorrhage is present predominantly within the sylvian fissures and overlying the  cerebellum and there is intraventricular hemorrhage pooling within occipital horns and extending into the right lateral ventricle. Ventricle size is stable. No downward herniation. Area of reduced diffusion within the right lentiform nucleus extending into caudate body and corona radiata compatible with acute infarction. There are several additional small cortical infarctions within the right MCA distribution. Vascular: As below. Skull and upper cervical spine: Normal marrow signal. Sinuses/Orbits: No abnormal signal of mastoid air cells. Bilateral intra-ocular lens replacement. Extensive paranasal sinus disease the right ethmoid and maxillary sinus opacification. Other: None. MRA HEAD FINDINGS Internal carotid arteries: Right cavernous ICA medially directed aneurysm measuring 2 mm to dome with 4 mm base (series 4, image 103). Anterior cerebral arteries:  Patent. Middle cerebral arteries: Patent left M1 and distal circulation. Patent right M1 and proximal M2 branches. Several M2 branches extending to the right anterolateral frontal region appear occluded, the area is partially obscured by artifact from motion and the presence of hemorrhage within the MCA cistern. Anterior communicating artery: Patent. Posterior communicating arteries:  Probable diminutive bilaterally. Posterior cerebral arteries:  Patent. Basilar artery:  Patent. Vertebral arteries:  Patent. IMPRESSION: MRI head: 1. Stable large acute hemorrhage within right anterior temporal lobe and right frontal lobe. 2. Acute hemorrhage within right caudothalamic groove and right putamen are stable mildly increased in size given differences in technique. 3. Subarachnoid hemorrhage predominantly within sylvian fissures and right MCA cistern. 4. Intraventricular hemorrhage within occipital horns and right temporal horn of lateral ventricle. Stable ventricle size. 5. Acute infarct of right basal ganglia involving putamen and caudate body. Additional scattered acute  cortical infarcts in right MCA distribution. 6. Stable mass effect with partial effacement of right lateral ventricle and 5 mm right-to-left midline shift. MRA head: 1. Several M2 branches extending  to the right anterolateral frontal region appear occluded, the area is partially obscured by artifact from motion and the presence of hemorrhage within the MCA cistern. 2. Right cavernous ICA medially directed aneurysm measuring 2 mm to dome with 4 mm base. 3. Otherwise no large vessel occlusion, aneurysm, or significant stenosis is identified. Electronically Signed   By: Mitzi HansenLance  Furusawa-Stratton M.D.   On: 10/25/2017 14:23   Ir [place] Guide Vasc Access Right  Result Date: 10/31/2017 INDICATION: 83 year old female with a history of acute right MCA syndrome, normal baseline, stroke exam of 12, CT imaging showing large vessel occlusion. She has been transferred after a dose of IV tPA, formed attempt at mechanical thrombectomy. CT performed to evaluate for ASPECTS decay shows stable aspects score, however, hemorrhagic transformation in several small peripheral regions and basal ganglia. After a thorough discussion of risks and benefits with the Stroke Neurology team, it is felt that attempting mechanical thrombectomy will give the patient the best chance of recovery. EXAM: ULTRASOUND GUIDED ACCESS RIGHT COMMON FEMORAL ARTERY CEREBRAL ANGIOGRAM MECHANICAL THROMBECTOMY RIGHT M2 BRANCH SUTURE MEDIATED CLOSURE OF RIGHT COMMON FEMORAL ARTERY ACCESS COMPARISON:  CT imaging same day MEDICATIONS: Vancomycin 1 gm IV. The antibiotic was administered within 1 hour of the procedure ANESTHESIA/SEDATION: General anesthesia CONTRAST:  148 cc Isovue FLUOROSCOPY TIME:  Fluoroscopy Time: 56 minutes 48 seconds (1,910 mGy). COMPLICATIONS: None immediate. TECHNIQUE: Emergent consent was obtained/presumed from as there is no family available. Specific risks include: Bleeding, infection, contrast reaction, kidney injury/failure, need for further  procedure/surgery, arterial injury or dissection, embolization to new territory, intracranial hemorrhage (10-15% risk), neurologic deterioration, cardiopulmonary collapse, death. All questions were addressed. Maximal Sterile Barrier Technique was utilized including during the procedure including caps, mask, sterile gowns, sterile gloves, sterile drape, hand hygiene and skin antiseptic. A timeout was performed prior to the initiation of the procedure. FINDINGS: Initial: Significant tortuosity at the aortic arch with type 3 arch Right common carotid artery: Mild tortuosity of the common carotid artery without significant plaque. Right external carotid artery: Patent with antegrade flow. Right internal carotid artery: Normal course caliber and contour of the cervical portion. Vertical and petrous segment patent with normal course caliber contour. Cavernous segment patent. Clinoid segment patent. Antegrade flow of the ophthalmic artery. Ophthalmic segment patent. Terminus patent. Right MCA:  M1 segment patent.  Occlusion of superior division. Right ACA: A 1 segment patent. A 2 segment perfuses the right territory. Cross filling of the communicating artery with some flash filling of the left anterior cerebral territory. Final: Final angiogram demonstrates restoration of TICI3 flow of the MCA territory. PROCEDURE: Patient is brought to the interventional radiology suite with the patient's identity confirmed. Patient positioned supine position on the fluoroscopy table. General anesthesia was induced by the anesthesia team. Patient is then prepped and draped in the usual sterile fashion. Ultrasound survey of the right inguinal region was performed with images stored and sent to PACs. 11 blade scalpel was used to make a small incision. A micropuncture needle was used access the right common femoral artery under ultrasound. With excellent arterial blood flow returned, an .018 micro wire was passed through the needle, observed  to enter the abdominal aorta under fluoroscopy. The needle was removed, and a micropuncture sheath was placed over the wire. The inner dilator and wire were removed, and an 035 Bentson wire was advanced under fluoroscopy into the abdominal aorta. The sheath was removed and a standard 5 [place]French vascular sheath was placed. The dilator was removed and the  sheath was flushed. A 51F JB-1 diagnostic catheter was advanced over the wire to the proximal descending thoracic aorta. Wire was then removed. Double flush of the catheter was performed. Catheter was then used to select the innominate artery. Angiogram was performed. Using roadmap technique, the catheter was advanced over a roadrunner wire, immediately flipping into the aortic arch given the tortuosity. Roadrunner wire was removed and a Glidewire was used. Using the Glidewire, catheter was advanced into the proximal segment of the internal carotid artery. Attempt a passing the Rosen wire was initially unsuccessful, as the diagnostic catheter withdrew into the proximal internal carotid artery. Roadrunner wire was then passed into the distal ICA, and the diagnostic catheter was then repositioned into the distal ICA. Rosen wire was then placed into the distal ICA. Formal angiogram was performed. The 5 French sheath was removed and exchanged for 8 French 55 centimeter BrightTip sheath. Sheath was flushed and attached to pressurized and heparinized saline bag for constant forward flow. Then an 8 [place], 85 cm Flowgate balloon tip catheter was prepared on the back table with inflation of the balloon with 50/50 concentration of dilute contrast. The balloon catheter was then advanced over the wire, an using the coaxial Bern shaped catheter, positioned into the mid internal carotid artery. Copious back flush was performed and the balloon catheter was attached to heparinized and pressurized saline bag for forward flow. Angiogram was performed for road map guide. Microcatheter  system was then introduced through the balloon guide catheter, using a synchro soft 014 wire and a Trevo Provue18 catheter. Microcatheter system was advanced into the internal carotid artery, to the level of the occlusion. The micro wire was then carefully advanced through the occluded M2 segment. Microcatheter would not pass over the wire through the occluded segment without significant tension on the system, which caused the balloon guide catheter to prolapse into the aortic arch. A was necessary to removed microcatheter and microwire, and reduce the system by removal of the balloon guide catheter. JB 1 diagnostic catheter and Glidewire were then used to select the innominate artery. JB 1 catheter was advanced into the internal carotid artery over the standard Glidewire. Again, the roadrunner wire was then placed into the distal internal carotid artery an the JB 1 catheter was advanced distally to the internal carotid artery. Rosen wire was then placed to the distal internal carotid artery. Eight [place] Merci balloon guide catheter was then advanced over the Rosen wire into the internal carotid artery, using coaxial straight catheter/introducer. Once the Merci balloon guide catheter was in place, roadmap angiogram was performed. Microcatheter and synchro soft wire were then used to navigate to the face of the occlusion. Microcatheter was then push through the occluded segment and the wire was removed. Blood was then aspirated through the hub of the microcatheter, and a gentle contrast injection was performed confirming intraluminal position. A rotating hemostatic valve was then attached to the back end of the microcatheter, and a pressurized and heparinized saline bag was attached to the catheter. 4 x 40 solitaire device was then selected. Back flush was achieved at the rotating hemostatic valve, and then the device was gently advanced through the microcatheter to the distal end. The retriever was then unsheathed  by withdrawing the microcatheter under fluoroscopy. Once the retriever was completely unsheathed, control angiogram was performed from the balloon catheter. The balloon at the balloon tip catheter was then inflated under fluoroscopy for proximal flow arrest. Constant aspiration was then performed at the tip of  the balloon guide catheter as the retriever was gently and slowly withdrawn with fluoroscopic observation. Once the retriever was entirely removed from the system, free aspiration was confirmed at the hub of the balloon guide catheter, with free blood return confirmed. The balloon was then deflated, rotating hemostatic valve was reattached, and a control angiogram was performed. Segment remained occluded after the first pass. Again, the microcatheter and microwire were advanced to the face of the occlusion, and through the occlusion into the angular branch. Wire was removed. Blood was then aspirated through the hub of the microcatheter, and a gentle contrast injection was performed confirming intraluminal position. A rotating hemostatic valve was then attached to the back end of the microcatheter, and a pressurized and heparinized saline bag was attached to the catheter. 4 x 40 solitaire device was then selected. Back flush was achieved at the rotating hemostatic valve, and then the device was gently advanced through the microcatheter to the distal end. The retriever was then unsheathed by withdrawing the microcatheter under fluoroscopy. Once the retriever was completely unsheathed, control angiogram was performed from the balloon catheter. Segment remained occluded after the second pass. A cat 5 intermediate catheter was then loaded with the coaxial Trevo microcatheter. Combination the microcatheter and the intermediate catheter were then advanced to the proximal aspect of the clot. Wire was navigated into the angular branch with a microcatheter passed into the angular branch. Cat 5 catheter was advanced into  the distal M1 segment. 4 x 40 solitaire device was then again selected and deployed. Mercy balloon catheter was inflated. After deployment, the cat 5 catheter was advanced under aspiration over the solitaire into the origin of the occluded segment. The entire system was then removed. Angiogram was performed, confirming restoration of TICI 3 flow. Control angiogram was performed at the right common femoral artery puncture site. The 55 centimeter 8 French sheath was removed over the roadrunner wire. Overlapping suture devices were used for closure of the right common femoral artery access. Patient tolerated the procedure well and remained hemodynamically stable throughout. Estimated blood loss from a scalp laceration is approximately 200 cc. IMPRESSION: Status post cerebral angiogram and successful mechanical thrombectomy of occluded right superior division M2 branch, with restoration of TICI 3 flow. Closure of right common femoral artery access with overlapping suture mediated devices. Signed, Yvone Neu. Loreta Ave DO [facility] Radiology PLAN: Patient extubated in IR. Transport to CT for post CT Overnight ICU admission Right leg straight overnight for hemostasis. Frequent neurovascular checks. Electronically Signed   By: Gilmer Mor D.O.   On: 10/29/2017 16:41   Dg Chest Port 1 View  Result Date: 10/25/2017 CLINICAL DATA:  Status post central line placement. Acute CVA. History of asthma and hypertension. EXAM: PORTABLE CHEST 1 VIEW COMPARISON:  Chest x-ray of October 24, 2017 FINDINGS: The lungs are well-expanded. There is no focal infiltrate. There is no pleural effusion or pneumothorax. The left subclavian venous catheter tip projects over the midportion of the SVC. The heart is mildly enlarged. The pulmonary vascularity is normal. There is no IMPRESSION: No postprocedure complication following left internal jugular venous catheter placement. Mild cardiomegaly  without pulmonary edema. Thoracic aortic atherosclerosis. Electronically Signed   By: David  [place] M.D.   On: 10/25/2017 10:09   Dg Chest Port 1 View  Result Date: 10/25/2017 CLINICAL DATA:  Abnormal chest x-ray EXAM: PORTABLE CHEST 1 VIEW COMPARISON:  06/08/2017 FINDINGS: Mild to moderate cardiomegaly. Aortic atherosclerosis. No consolidation or effusion. No pneumothorax.  IMPRESSION: Cardiomegaly without edema or infiltrate. Electronically Signed   By: Jasmine Pang M.D.   On: 10/25/2017 03:45   Ir Percutaneous Art Thrombectomy/infusion Intracranial Inc Diag Angio  Result Date: Nov 09, 2017 INDICATION: 83 year old female with a history of acute right MCA syndrome, normal baseline, stroke exam of 12, CT imaging showing large vessel occlusion. She has been transferred after a dose of IV tPA, formed attempt at mechanical thrombectomy. CT performed to evaluate for ASPECTS decay shows stable aspects score, however, hemorrhagic transformation in several small peripheral regions and basal ganglia. After a thorough discussion of risks and benefits with the Stroke Neurology team, it is felt that attempting mechanical thrombectomy will give the patient the best chance of recovery. EXAM: ULTRASOUND GUIDED ACCESS RIGHT COMMON FEMORAL ARTERY CEREBRAL ANGIOGRAM MECHANICAL THROMBECTOMY RIGHT M2 BRANCH SUTURE MEDIATED CLOSURE OF RIGHT COMMON FEMORAL ARTERY ACCESS COMPARISON:  CT imaging same day MEDICATIONS: Vancomycin 1 gm IV. The antibiotic was administered within 1 hour of the procedure ANESTHESIA/SEDATION: General anesthesia CONTRAST:  148 cc Isovue FLUOROSCOPY TIME:  Fluoroscopy Time: 56 minutes 48 seconds (1,910 mGy). COMPLICATIONS: None immediate. TECHNIQUE: Emergent consent was obtained/presumed from as there is no family available. Specific risks include: Bleeding, infection, contrast reaction, kidney injury/failure, need for further procedure/surgery, arterial injury or dissection, embolization to new territory,  intracranial hemorrhage (10-15% risk), neurologic deterioration, cardiopulmonary collapse, death. All questions were addressed. Maximal Sterile Barrier Technique was utilized including during the procedure including caps, mask, sterile gowns, sterile gloves, sterile drape, hand hygiene and skin antiseptic. A timeout was performed prior to the initiation of the procedure. FINDINGS: Initial: Significant tortuosity at the aortic arch with type 3 arch Right common carotid artery: Mild tortuosity of the common carotid artery without significant plaque. Right external carotid artery: Patent with antegrade flow. Right internal carotid artery: Normal course caliber and contour of the cervical portion. Vertical and petrous segment patent with normal course caliber contour. Cavernous segment patent. Clinoid segment patent. Antegrade flow of the ophthalmic artery. Ophthalmic segment patent. Terminus patent. Right MCA:  M1 segment patent.  Occlusion of superior division. Right ACA: A 1 segment patent. A 2 segment perfuses the right territory. Cross filling of the communicating artery with some flash filling of the left anterior cerebral territory. Final: Final angiogram demonstrates restoration of TICI3 flow of the MCA territory. PROCEDURE: Patient is brought to the interventional radiology suite with the patient's identity confirmed. Patient positioned supine position on the fluoroscopy table. General anesthesia was induced by the anesthesia team. Patient is then prepped and draped in the usual sterile fashion. Ultrasound survey of the right inguinal region was performed with images stored and sent to PACs. 11 blade scalpel was used to make a small incision. A micropuncture needle was used access the right common femoral artery under ultrasound. With excellent arterial blood flow returned, an .018 micro wire was passed through the needle, observed to enter the abdominal aorta under fluoroscopy. The needle was removed, and a  micropuncture sheath was placed over the wire. The inner dilator and wire were removed, and an 035 Bentson wire was advanced under fluoroscopy into the abdominal aorta. The sheath was removed and a standard 5 [place] vascular sheath was placed. The dilator was removed and the sheath was flushed. A 57F JB-1 diagnostic catheter was advanced over the wire to the proximal descending thoracic aorta. Wire was then removed. Double flush of the catheter was performed. Catheter was then used to select the innominate artery. Angiogram was performed. Using roadmap technique, the  catheter was advanced over a roadrunner wire, immediately flipping into the aortic arch given the tortuosity. Roadrunner wire was removed and a Glidewire was used. Using the Glidewire, catheter was advanced into the proximal segment of the internal carotid artery. Attempt a passing the Rosen wire was initially unsuccessful, as the diagnostic catheter withdrew into the proximal internal carotid artery. Roadrunner wire was then passed into the distal ICA, and the diagnostic catheter was then repositioned into the distal ICA. Rosen wire was then placed into the distal ICA. Formal angiogram was performed. The 5 French sheath was removed and exchanged for 8 French 55 centimeter BrightTip sheath. Sheath was flushed and attached to pressurized and heparinized saline bag for constant forward flow. Then an 8 [place], 85 cm Flowgate balloon tip catheter was prepared on the back table with inflation of the balloon with 50/50 concentration of dilute contrast. The balloon catheter was then advanced over the wire, an using the coaxial Bern shaped catheter, positioned into the mid internal carotid artery. Copious back flush was performed and the balloon catheter was attached to heparinized and pressurized saline bag for forward flow. Angiogram was performed for road map guide. Microcatheter system was then introduced through the balloon guide catheter, using a synchro  soft 014 wire and a Trevo Provue18 catheter. Microcatheter system was advanced into the internal carotid artery, to the level of the occlusion. The micro wire was then carefully advanced through the occluded M2 segment. Microcatheter would not pass over the wire through the occluded segment without significant tension on the system, which caused the balloon guide catheter to prolapse into the aortic arch. A was necessary to removed microcatheter and microwire, and reduce the system by removal of the balloon guide catheter. JB 1 diagnostic catheter and Glidewire were then used to select the innominate artery. JB 1 catheter was advanced into the internal carotid artery over the standard Glidewire. Again, the roadrunner wire was then placed into the distal internal carotid artery an the JB 1 catheter was advanced distally to the internal carotid artery. Rosen wire was then placed to the distal internal carotid artery. Eight [place] Merci balloon guide catheter was then advanced over the Rosen wire into the internal carotid artery, using coaxial straight catheter/introducer. Once the Merci balloon guide catheter was in place, roadmap angiogram was performed. Microcatheter and synchro soft wire were then used to navigate to the face of the occlusion. Microcatheter was then push through the occluded segment and the wire was removed. Blood was then aspirated through the hub of the microcatheter, and a gentle contrast injection was performed confirming intraluminal position. A rotating hemostatic valve was then attached to the back end of the microcatheter, and a pressurized and heparinized saline bag was attached to the catheter. 4 x 40 solitaire device was then selected. Back flush was achieved at the rotating hemostatic valve, and then the device was gently advanced through the microcatheter to the distal end. The retriever was then unsheathed by withdrawing the microcatheter under fluoroscopy. Once the retriever was  completely unsheathed, control angiogram was performed from the balloon catheter. The balloon at the balloon tip catheter was then inflated under fluoroscopy for proximal flow arrest. Constant aspiration was then performed at the tip of the balloon guide catheter as the retriever was gently and slowly withdrawn with fluoroscopic observation. Once the retriever was entirely removed from the system, free aspiration was confirmed at the hub of the balloon guide catheter, with free blood return confirmed. The balloon was then deflated,  rotating hemostatic valve was reattached, and a control angiogram was performed. Segment remained occluded after the first pass. Again, the microcatheter and microwire were advanced to the face of the occlusion, and through the occlusion into the angular branch. Wire was removed. Blood was then aspirated through the hub of the microcatheter, and a gentle contrast injection was performed confirming intraluminal position. A rotating hemostatic valve was then attached to the back end of the microcatheter, and a pressurized and heparinized saline bag was attached to the catheter. 4 x 40 solitaire device was then selected. Back flush was achieved at the rotating hemostatic valve, and then the device was gently advanced through the microcatheter to the distal end. The retriever was then unsheathed by withdrawing the microcatheter under fluoroscopy. Once the retriever was completely unsheathed, control angiogram was performed from the balloon catheter. Segment remained occluded after the second pass. A cat 5 intermediate catheter was then loaded with the coaxial Trevo microcatheter. Combination the microcatheter and the intermediate catheter were then advanced to the proximal aspect of the clot. Wire was navigated into the angular branch with a microcatheter passed into the angular branch. Cat 5 catheter was advanced into the distal M1 segment. 4 x 40 solitaire device was then again selected and  deployed. Mercy balloon catheter was inflated. After deployment, the cat 5 catheter was advanced under aspiration over the solitaire into the origin of the occluded segment. The entire system was then removed. Angiogram was performed, confirming restoration of TICI 3 flow. Control angiogram was performed at the right common femoral artery puncture site. The 55 centimeter 8 French sheath was removed over the roadrunner wire. Overlapping suture devices were used for closure of the right common femoral artery access. Patient tolerated the procedure well and remained hemodynamically stable throughout. Estimated blood loss from a scalp laceration is approximately 200 cc. IMPRESSION: Status post cerebral angiogram and successful mechanical thrombectomy of occluded right superior division M2 branch, with restoration of TICI 3 flow. Closure of right common femoral artery access with overlapping suture mediated devices. Signed, Yvone Neu. Loreta Ave DO [facility] Radiology PLAN: Patient extubated in IR. Transport to CT for post CT Overnight ICU admission Right leg straight overnight for hemostasis. Frequent neurovascular checks. Electronically Signed   By: Gilmer Mor D.O.   On: 11/04/2017 16:41   Dg Hip Unilat With Pelvis 2-3 Views Left  Result Date: 10/26/2017 CLINICAL DATA:  Left hip fracture. EXAM: DG HIP (WITH OR WITHOUT PELVIS) 2-3V LEFT COMPARISON:  10/22/2017 FINDINGS: Transverse fracture of the left femoral neck with varus angulation. Superior and lateral displacement of the distal fracture fragment with respect to the proximal fragment. No dislocation at the hip joint. Similar appearance to previous study. Pelvis appears intact. SI joints and symphysis pubis are not displaced. Visualized sacrum appears intact. IMPRESSION: Transverse subcapital fracture of the left femoral neck with varus angulation. No change in position since previous study. Electronically Signed    By: Burman Nieves M.D.   On: 10/26/2017 00:11    Labs:  CBC: Recent Labs    10/25/17 1018 10/25/17 1525 10/25/17 2158 10/26/17 0133  WBC 6.2 6.0 5.7 5.0  HGB 7.7* 7.7* 7.6* 7.4*  HCT 22.2* 22.4* 22.0* 21.7*  PLT 84* 95* 96* 102*    COAGS: Recent Labs    10/27/2017 2102 10/26/17 0133  INR 1.38 1.25    BMP: Recent Labs    11/14/2017 1504 10/27/2017 2102  10/25/17 0510  10/25/17 1525 10/25/17 1938 10/26/17  0133 10/26/17 0646  NA 135 132*   < > 136   < > 141 143 146* 151*  K 3.1* 3.6  --  4.1  --   --   --  3.3*  --   CL  --  106  --  108  --   --   --  123*  --   CO2  --  19*  --  18*  --   --   --  20*  --   GLUCOSE  --  144*  --  124*  --   --   --  129*  --   BUN  --  13  --  13  --   --   --  15  --   CALCIUM  --  7.6*  --  7.8*  --   --   --  8.0*  --   CREATININE  --  0.71  --  0.71  --   --   --  0.69  --   GFRNONAA  --  >60  --  >60  --   --   --  >60  --   GFRAA  --  >60  --  >60  --   --   --  >60  --    < > = values in this interval not displayed.    LIVER FUNCTION TESTS: No results for input(s): BILITOT, AST, ALT, ALKPHOS, PROT, ALBUMIN in the last 8760 hours.  Assessment and Plan:  CVA Right middle cerebral artery revascularization in IR 11/4 Right side moving well; left foot slowly Plan for hip replacement tomorrow or so per RN  Electronically Signed: Saurav Crumble A, PA-C 10/26/2017, 7:58 AM   I spent a total of 15 Minutes at the the patient's bedside AND on the patient's hospital floor or unit, greater than 50% of which was counseling/coordinating care for R MCA revasc

## 2017-10-26 NOTE — Evaluation (Signed)
Clinical/Bedside Swallow Evaluation Patient Details  Name: Carol BrasilSusie M Parker MRN: [id number] Date of Birth: [birth year]  Today's Date: 10/26/2017 Time: SLP Start Time (ACUTE ONLY): 1129 SLP Stop Time (ACUTE ONLY): 1145 SLP Time Calculation (min) (ACUTE ONLY): 16 min  Past Medical History: History reviewed. No pertinent past medical history. Past Surgical History:  Past Surgical History:  Procedure Laterality Date  . IR PERCUTANEOUS ART THROMBECTOMY/INFUSION INTRACRANIAL INC DIAG ANGIO  10/21/2017  . IR US GUIDE VASC ACCESS RIGHT  11/03/2017   HPI:  Pt is an 83 y.o.female [facility]p fall with L hip fx. Pt was taken off her Xarelto pending surgical repair but developed L sided weakness, facial droop, and AMS. Pt had a R MCA infarct s/p tPA resulting in small R frontal and occipital SAD. She also underwent IR mechanical thrombectomy but causing R frontal, temporal, and occiptal large hematomas with SAH and IVH. PMH includes HTN, DM< a fib, and UTI.   Assessment / Plan / Recommendation Clinical Impression  Pt does not appear to be managing her secretions well, and therefore evaluation focused primarily on oral care and removal of moderate amounts of thick secretions adhered to her posterior hard palate and velum. Her cough is weak and her voice remains wet even after suction and aggressive oral care. Pt had difficulty keeping her mouth open, saying that her jaw was fatigued. She is not yet ready for POs. Will continue to follow with recommendations for frequent, thorough oral care for now. SLP Visit Diagnosis: Dysphagia, unspecified (R13.10)    Aspiration Risk  Severe aspiration risk    Diet Recommendation NPO   Medication Administration: Via alternative means    Other  Recommendations Oral Care Recommendations: Oral care QID Other Recommendations: Have oral suction available   Follow up Recommendations Skilled Nursing facility      Frequency and Duration min 3x week   2 weeks       Prognosis Prognosis for Safe Diet Advancement: Fair Barriers to Reach Goals: Severity of deficits      Swallow Study   General HPI: Pt is an 83 y.o.female [facility]/p fall with L hip fx. Pt was taken off her Xarelto pending surgical repair but developed L sided weakness, facial droop, and AMS. Pt had a R MCA infarct s/p tPA resulting in small R frontal and occipital SAD. She also underwent IR mechanical thrombectomy but causing R frontal, temporal, and occiptal large hematomas with SAH and IVH. PMH includes HTN, DM< a fib, and UTI. Type of Study: Bedside Swallow Evaluation Previous Swallow Assessment: none in chart Diet Prior to this Study: NPO Temperature Spikes Noted: No Respiratory Status: Room air History of Recent Intubation: No Behavior/Cognition: Lethargic/Drowsy;Cooperative;Requires cueing Oral Cavity Assessment: Dry;Dried secretions Oral Care Completed by SLP: Yes Oral Cavity - Dentition: Adequate natural dentition;Missing dentition Patient Positioning: Upright in bed Baseline Vocal Quality: Wet;Other (comment)(weak) Volitional Cough: Weak;Congested    Oral/Motor/Sensory Function     Ice Chips Ice chips: Not tested   Thin Liquid Thin Liquid: Not tested    Nectar Thick Nectar Thick Liquid: Not tested   Honey Thick Honey Thick Liquid: Not tested   Puree Puree: Not tested   Solid   GO   Solid: Not tested        Carol Parker, Carol Parker 10/26/2017,11:33 AM  Carol Parker, M.A. CCC-SLP [phone]

## 2017-10-26 NOTE — Progress Notes (Signed)
PULMONARY / CRITICAL CARE MEDICINE   Name: Carol Parker MRN: [id number] DOB: 05-01-1929    ADMISSION DATE:  10/31/2017   CHIEF COMPLAINT:  Left hemiparesis  HISTORY OF PRESENT ILLNESS:   This is an 83 year old who normally takes Xeralto for chronic atrial fibrillation.  She fell on 11/2 and fractured her left hip.  Xarelto was held anticipating surgery on her hip but on 11/4 she developed a left hemiparesis.  She was given TPA and transferred to this hospital where a thrombectomy for a right MCA occlusion was performed.  Following the thrombectomy her hemoglobin had dropped substantially and she received 2 units of packed red blood cells and the TPA was reversed with cryoprecipitate.  Follow-up CT scan on 11/5 has shown some mass-effect from her stroke with a 4.6 mm midline shift.  3% saline is in place. This morning she is more alert for me. She complains only of left hip pain.  PAST MEDICAL HISTORY :  DM, atrial fibrillation  PAST SURGICAL HISTORY: She  has a past surgical history that includes IR PERCUTANEOUS ART THROMBECTOMY/INFUSION INTRACRANIAL INC DIAG ANGIO (11/16/2017); IR US Guide Vasc Access Right (10/31/2017); and RADIOLOGY WITH ANESTHESIA (N/A, 11/10/2017).  Allergies  Allergen Reactions  . Penicillins Rash    No current facility-administered medications on file prior to encounter.    Current Outpatient Medications on File Prior to Encounter  Medication Sig  . albuterol (PROVENTIL HFA;VENTOLIN HFA) 108 (90 Base) MCG/ACT inhaler Inhale 2 puffs every 4 (four) hours as needed into the lungs for wheezing or shortness of breath.   . calcium carbonate (CALCIUM 600) 1500 (600 Ca) MG TABS tablet Take 600 mg of elemental calcium 2 (two) times daily with a meal by mouth.  . Cyanocobalamin (VITAMIN B 12 PO) Take 2,500 mcg daily by mouth.  . losartan (COZAAR) 50 MG tablet Take 50 mg daily by mouth.  . metFORMIN (GLUCOPHAGE-XR) 500 MG 24 hr tablet Take 500 mg daily by mouth.  .  metoprolol succinate (TOPROL-XL) 50 MG 24 hr tablet Take 50 mg daily by mouth. Take with or immediately following a meal.  . Rivaroxaban (XARELTO) 15 MG TABS tablet Take 15 mg daily by mouth.   . terbinafine (LAMISIL) 1 % cream Apply 1 application topically 2 (two) times daily.  Marland Kitchen terbinafine (LAMISIL) 250 MG tablet Take 250 mg every other day by mouth.  . triamcinolone cream (KENALOG) 0.5 % Apply 1 application See admin instructions topically. Filled 07/19/17: apply topically to feet three times daily for 3 weeks, stop for 1 week, then repeat process  . lidocaine (XYLOCAINE) 5 % ointment Apply 1 application topically 4 (four) times daily as needed. (Patient not taking: Reported on 10/25/2017)    FAMILY HISTORY:  Her has no family status information on file.    SOCIAL HISTORY: She  has an unknown smoking status. she has never used smokeless tobacco.  REVIEW OF SYSTEMS:   As above  SUBJECTIVE:  As above  VITAL SIGNS: BP (!) 153/77   Pulse 77   Temp 97.9 F (36.6 C) (Oral)   Resp 14   Ht 5\' 7"  (1.702 m)   Wt 152 lb 8.9 oz (69.2 kg)   SpO2 100%   BMI 23.89 kg/m       INTAKE / OUTPUT: I/O last 3 completed shifts: In: 2861.8 [I.V.:1826.8; Blood:635; IV Piggyback:400] Out: 1235 [Urine:1235]  PHYSICAL EXAMINATION: General: Chronically ill appearing elderly female in NAD. She is verbally interactive this morning Neuro: Speech is  a little broken, but she does respond to questions. Oriented to place but not day or date or reason she is in hospital. Left facial droop. Pupils equal Left hemiparesis.  Cardiovascular: No JVD.  S1 and S2 are irreg  irreg without murmur or gallop.  The feet are pink and warm without dependent edema. Lungs: There is symmetric air movement respirations are unlabored.  There are no wheezes there are a few scattered rhonchi. Abdomen: Soft without any organomegaly masses tenderness guarding or rebound.  Right groin access site is dry without  hematoma. Musculoskeletal: She has some slight external rotation of the left foot.  No significant ecchymoses over the left hip.   LABS:  BMET Recent Labs  Lab 10-25-2017 2102  10/25/17 0510  10/25/17 1525 10/25/17 1938 10/26/17 0133  NA 132*   < > 136   < > 141 143 146*  K 3.6  --  4.1  --   --   --  3.3*  CL 106  --  108  --   --   --  123*  CO2 19*  --  18*  --   --   --  20*  BUN 13  --  13  --   --   --  15  CREATININE 0.71  --  0.71  --   --   --  0.69  GLUCOSE 144*  --  124*  --   --   --  129*   < > = values in this interval not displayed.    Electrolytes Recent Labs  Lab 25-Oct-2017 2102 10/25/17 0510 10/26/17 0133  CALCIUM 7.6* 7.8* 8.0*  MG 1.3* 2.5* 2.0  PHOS 3.2 2.7 1.6*    CBC Recent Labs  Lab 10/25/17 1525 10/25/17 2158 10/26/17 0133  WBC 6.0 5.7 5.0  HGB 7.7* 7.6* 7.4*  HCT 22.4* 22.0* 21.7*  PLT 95* 96* 102*    Coag's Recent Labs  Lab 25-Oct-2017 2102 10/26/17 0133  INR 1.38 1.25    Sepsis Markers No results for input(s): LATICACIDVEN, PROCALCITON, O2SATVEN in the last 168 hours.  ABG Recent Labs  Lab 10-25-17 1504 2017/10/25 2117  PHART 7.374 7.366  PCO2ART 34.8 34.9  PO2ART 275.0* 185.0*    Liver Enzymes No results for input(s): AST, ALT, ALKPHOS, BILITOT, ALBUMIN in the last 168 hours.  Cardiac Enzymes No results for input(s): TROPONINI, PROBNP in the last 168 hours.  Glucose Recent Labs  Lab 10-25-17 1643 10-25-17 2216 10/25/17 0725 10/25/17 1143 10/25/17 1650 10/25/17 2019  GLUCAP 117* 118* 133* 122* 137* 118*    Imaging Mr Maxine Glenn Head Wo Contrast  Result Date: 10/25/2017 CLINICAL DATA:  83 y/o  F; stroke follow-up. EXAM: MRI HEAD WITHOUT CONTRAST MRA HEAD WITHOUT CONTRAST TECHNIQUE: Multiplanar, multiecho pulse sequences of the brain and surrounding structures were obtained without intravenous contrast. Angiographic images of the head were obtained using MRA technique without contrast. COMPARISON:  10/25/2017 CT head.  FINDINGS: MRI HEAD FINDINGS Brain: Multiple acute parenchymal hemorrhage with intermediate T1 and hypo/intermediate T2 signal. Large hematoma within the right anterior temporal lobe and the right frontal lobe are grossly stable from prior CT of head given differences in technique. Hemorrhage within the caudothalamic groove and the right putamen are stable to mildly increased in size. Surrounding T2 FLAIR signal abnormality is compatible with vasogenic edema and resultant mass effect partially effaces the right lateral ventricle and results in 5 mm right to left midline shift when measured in a similar fashion (series  7, image 18). Subarachnoid hemorrhage is present predominantly within the sylvian fissures and overlying the cerebellum and there is intraventricular hemorrhage pooling within occipital horns and extending into the right lateral ventricle. Ventricle size is stable. No downward herniation. Area of reduced diffusion within the right lentiform nucleus extending into caudate body and corona radiata compatible with acute infarction. There are several additional small cortical infarctions within the right MCA distribution. Vascular: As below. Skull and upper cervical spine: Normal marrow signal. Sinuses/Orbits: No abnormal signal of mastoid air cells. Bilateral intra-ocular lens replacement. Extensive paranasal sinus disease the right ethmoid and maxillary sinus opacification. Other: None. MRA HEAD FINDINGS Internal carotid arteries: Right cavernous ICA medially directed aneurysm measuring 2 mm to dome with 4 mm base (series 4, image 103). Anterior cerebral arteries:  Patent. Middle cerebral arteries: Patent left M1 and distal circulation. Patent right M1 and proximal M2 branches. Several M2 branches extending to the right anterolateral frontal region appear occluded, the area is partially obscured by artifact from motion and the presence of hemorrhage within the MCA cistern. Anterior communicating artery:  Patent. Posterior communicating arteries:  Probable diminutive bilaterally. Posterior cerebral arteries:  Patent. Basilar artery:  Patent. Vertebral arteries:  Patent. IMPRESSION: MRI head: 1. Stable large acute hemorrhage within right anterior temporal lobe and right frontal lobe. 2. Acute hemorrhage within right caudothalamic groove and right putamen are stable mildly increased in size given differences in technique. 3. Subarachnoid hemorrhage predominantly within sylvian fissures and right MCA cistern. 4. Intraventricular hemorrhage within occipital horns and right temporal horn of lateral ventricle. Stable ventricle size. 5. Acute infarct of right basal ganglia involving putamen and caudate body. Additional scattered acute cortical infarcts in right MCA distribution. 6. Stable mass effect with partial effacement of right lateral ventricle and 5 mm right-to-left midline shift. MRA head: 1. Several M2 branches extending to the right anterolateral frontal region appear occluded, the area is partially obscured by artifact from motion and the presence of hemorrhage within the MCA cistern. 2. Right cavernous ICA medially directed aneurysm measuring 2 mm to dome with 4 mm base. 3. Otherwise no large vessel occlusion, aneurysm, or significant stenosis is identified. Electronically Signed   By: Mitzi HansenLance  Furusawa-Stratton M.D.   On: 10/25/2017 14:23   Mr Brain Wo Contrast  Result Date: 10/25/2017 CLINICAL DATA:  83 y/o  F; stroke follow-up. EXAM: MRI HEAD WITHOUT CONTRAST MRA HEAD WITHOUT CONTRAST TECHNIQUE: Multiplanar, multiecho pulse sequences of the brain and surrounding structures were obtained without intravenous contrast. Angiographic images of the head were obtained using MRA technique without contrast. COMPARISON:  10/25/2017 CT head. FINDINGS: MRI HEAD FINDINGS Brain: Multiple acute parenchymal hemorrhage with intermediate T1 and hypo/intermediate T2 signal. Large hematoma within the right anterior temporal  lobe and the right frontal lobe are grossly stable from prior CT of head given differences in technique. Hemorrhage within the caudothalamic groove and the right putamen are stable to mildly increased in size. Surrounding T2 FLAIR signal abnormality is compatible with vasogenic edema and resultant mass effect partially effaces the right lateral ventricle and results in 5 mm right to left midline shift when measured in a similar fashion (series 7, image 18). Subarachnoid hemorrhage is present predominantly within the sylvian fissures and overlying the cerebellum and there is intraventricular hemorrhage pooling within occipital horns and extending into the right lateral ventricle. Ventricle size is stable. No downward herniation. Area of reduced diffusion within the right lentiform nucleus extending into caudate body and corona radiata compatible with acute infarction.  There are several additional small cortical infarctions within the right MCA distribution. Vascular: As below. Skull and upper cervical spine: Normal marrow signal. Sinuses/Orbits: No abnormal signal of mastoid air cells. Bilateral intra-ocular lens replacement. Extensive paranasal sinus disease the right ethmoid and maxillary sinus opacification. Other: None. MRA HEAD FINDINGS Internal carotid arteries: Right cavernous ICA medially directed aneurysm measuring 2 mm to dome with 4 mm base (series 4, image 103). Anterior cerebral arteries:  Patent. Middle cerebral arteries: Patent left M1 and distal circulation. Patent right M1 and proximal M2 branches. Several M2 branches extending to the right anterolateral frontal region appear occluded, the area is partially obscured by artifact from motion and the presence of hemorrhage within the MCA cistern. Anterior communicating artery: Patent. Posterior communicating arteries:  Probable diminutive bilaterally. Posterior cerebral arteries:  Patent. Basilar artery:  Patent. Vertebral arteries:  Patent. IMPRESSION:  MRI head: 1. Stable large acute hemorrhage within right anterior temporal lobe and right frontal lobe. 2. Acute hemorrhage within right caudothalamic groove and right putamen are stable mildly increased in size given differences in technique. 3. Subarachnoid hemorrhage predominantly within sylvian fissures and right MCA cistern. 4. Intraventricular hemorrhage within occipital horns and right temporal horn of lateral ventricle. Stable ventricle size. 5. Acute infarct of right basal ganglia involving putamen and caudate body. Additional scattered acute cortical infarcts in right MCA distribution. 6. Stable mass effect with partial effacement of right lateral ventricle and 5 mm right-to-left midline shift. MRA head: 1. Several M2 branches extending to the right anterolateral frontal region appear occluded, the area is partially obscured by artifact from motion and the presence of hemorrhage within the MCA cistern. 2. Right cavernous ICA medially directed aneurysm measuring 2 mm to dome with 4 mm base. 3. Otherwise no large vessel occlusion, aneurysm, or significant stenosis is identified. Electronically Signed   By: Mitzi HansenLance  Furusawa-Stratton M.D.   On: 10/25/2017 14:23   Dg Chest Port 1 View  Result Date: 10/25/2017 CLINICAL DATA:  Status post central line placement. Acute CVA. History of asthma and hypertension. EXAM: PORTABLE CHEST 1 VIEW COMPARISON:  Chest x-ray of October 24, 2017 FINDINGS: The lungs are well-expanded. There is no focal infiltrate. There is no pleural effusion or pneumothorax. The left subclavian venous catheter tip projects over the midportion of the SVC. The heart is mildly enlarged. The pulmonary vascularity is normal. There is no IMPRESSION: No postprocedure complication following left internal jugular venous catheter placement. Mild cardiomegaly without pulmonary edema. Thoracic aortic atherosclerosis. Electronically Signed   By: David  [place]azil[place] M.D.   On: 10/25/2017 10:09   Dg Hip Unilat  With Pelvis 2-3 Views Left  Result Date: 10/26/2017 CLINICAL DATA:  Left hip fracture. EXAM: DG HIP (WITH OR WITHOUT PELVIS) 2-3V LEFT COMPARISON:  10/22/2017 FINDINGS: Transverse fracture of the left femoral neck with varus angulation. Superior and lateral displacement of the distal fracture fragment with respect to the proximal fragment. No dislocation at the hip joint. Similar appearance to previous study. Pelvis appears intact. SI joints and symphysis pubis are not displaced. Visualized sacrum appears intact. IMPRESSION: Transverse subcapital fracture of the left femoral neck with varus angulation. No change in position since previous study. Electronically Signed   By: Burman NievesWilliam  Stevens M.D.   On: 10/26/2017 00:11     STUDIES:   CULTURES:   ANTIBIOTICS: none  SIGNIFICANT EVENTS: This is day 4 following hip fracture and day 2 following CVA  LINES/TUBES:   DISCUSSION: This is an 83 year old type II diabetic with chronic  atrial fibrillation who suffered from a fractured hip on 11/ 2.  She developed left hemiparesis on 11/4, received TPA and subsequently had a MCA thrombectomy. She has been receiving 3% saline and mental status has improved over the past 24 hrs.  ASSESSMENT / PLAN:  CARDIOVASCULAR A: She continues as needed antihypertensives. A fib is rate controlled, there is no need for addition of an AV blocking agent. IVC was full on echo, will give small dose of lasix if urine output remains marginal.  HEMATOLOGIC A: Hemoglobin is currently stable.  We will continue to monitor her vital signs and serial hemoglobins. She has a large potential space to bleed into her hip.  Scalp bleeding appears to have stopped.   NEUROLOGIC A: She suffered from a significant right MCA territory infarct.  There is midline shift and she has been ordered 3% saline. Mental status is improved this morning.   In addition there is blood in the ventricular system and we will need to monitor for the  potential evolution of hydrocephalus. There is also a small amount of subarachnoid blood, holding off on NSAIDs for pain control for another 24 hours. Awaiting a speech eval. Should she fail a swallow study, she will need acess for tube feedings and enteral meds  Finally she does have a hip fracture, which will likely not be repaired until 11/9.   She is at extremely high risk for DVT and I will order a screening Doppler today. Controlling pain with low dose Fentanyl.    Penny Pia, MD Critical Care Medicine [facility] Pager: [phone]  10/26/2017, 7:10 AM

## 2017-10-26 NOTE — Anesthesia Preprocedure Evaluation (Deleted)
Anesthesia Evaluation Anesthesia Physical Anesthesia Plan Anesthesia Quick Evaluation  

## 2017-10-26 NOTE — Progress Notes (Signed)
STROKE TEAM PROGRESS NOTE   SUBJECTIVE (INTERVAL HISTORY) Her RN is at the bedside.  Overall her condition is stable. She is more awake and alert than yesterday.  Scalp bleeding has been stopped, dressing is off.  She denies any pain.  BP stable, still has left hemiplegia.  Ethic committee recommended psychiatry consultation for capacity evaluation and anesthesia consultation for possible orthopedic surgery on Friday.   OBJECTIVE Temp:  [97.6 F (36.4 C)-98.4 F (36.9 C)] 97.6 F (36.4 C) (11/06 0800) Pulse Rate:  [68-96] 72 (11/06 0900) Cardiac Rhythm: Atrial fibrillation (11/06 0400) Resp:  [11-30] 18 (11/06 0900) BP: (109-167)/(47-98) 139/71 (11/06 0900) SpO2:  [96 %-100 %] 100 % (11/06 0900) Arterial Line BP: (103-181)/(41-70) 181/70 (11/06 0900)  Recent Labs  Lab 10/25/17 0725 10/25/17 1143 10/25/17 1650 10/25/17 2019 10/26/17 0734  GLUCAP 133* 122* 137* 118* 114*   Recent Labs  Lab 02/05/2017 1504 02/05/2017 2102  10/25/17 0510 10/25/17 0758 10/25/17 1525 10/25/17 1938 10/26/17 0133 10/26/17 0646  NA 135 132*   < > 136 136 141 143 146* 151*  K 3.1* 3.6  --  4.1  --   --   --  3.3*  --   CL  --  106  --  108  --   --   --  123*  --   CO2  --  19*  --  18*  --   --   --  20*  --   GLUCOSE  --  144*  --  124*  --   --   --  129*  --   BUN  --  13  --  13  --   --   --  15  --   CREATININE  --  0.71  --  0.71  --   --   --  0.69  --   CALCIUM  --  7.6*  --  7.8*  --   --   --  8.0*  --   MG  --  1.3*  --  2.5*  --   --   --  2.0  --   PHOS  --  3.2  --  2.7  --   --   --  1.6*  --    < > = values in this interval not displayed.   No results for input(s): AST, ALT, ALKPHOS, BILITOT, PROT, ALBUMIN in the last 168 hours. Recent Labs  Lab 10/25/17 0510 10/25/17 1018 10/25/17 1525 10/25/17 2158 10/26/17 0133  WBC 6.9 6.2 6.0 5.7 5.0  NEUTROABS 5.4 4.9 4.7 4.2 3.6  HGB 8.6* 7.7* 7.7* 7.6* 7.4*  HCT 24.9* 22.2* 22.4* 22.0* 21.7*  MCV 86.5 86.7 86.5 87.3 87.9  PLT  85* 84* 95* 96* 102*   No results for input(s): CKTOTAL, CKMB, CKMBINDEX, TROPONINI in the last 168 hours. Recent Labs    02/05/2017 2102 10/26/17 0133  LABPROT 16.9* 15.6*  INR 1.38 1.25   No results for input(s): COLORURINE, LABSPEC, PHURINE, GLUCOSEU, HGBUR, BILIRUBINUR, KETONESUR, PROTEINUR, UROBILINOGEN, NITRITE, LEUKOCYTESUR in the last 72 hours.  Invalid input(s): APPERANCEUR     Component Value Date/Time   CHOL 108 10/25/2017 0510   TRIG 79 10/25/2017 0510   HDL 41 10/25/2017 0510   CHOLHDL 2.6 10/25/2017 0510   VLDL 16 10/25/2017 0510   LDLCALC 51 10/25/2017 0510   Lab Results  Component Value Date   HGBA1C 5.8 (H) 10/25/2017   No results found for: LABOPIA, COCAINSCRNUR, LABBENZ, AMPHETMU,  THCU, LABBARB  No results for input(s): ETH in the last 168 hours.  I have personally reviewed the radiological images below and agree with the radiology interpretations.  Ct Head Wo Contrast 10/25/2017 IMPRESSION: 1. Multifocal intraparenchymal hemorrhage, some of which appear smaller most compatible with a component of resolving contrast staining. 2. Increasing intraventricular blood products with mild LEFT ventricle entrapment. 5 mm RIGHT to LEFT midline shift, decreased from 7 mm. 3. Small volume scattered subarachnoid hemorrhage. 4. Moderate LEFT parietoccipital scalp hematoma.   11-13-17 IMPRESSION: 1. Multiple sites of parenchymal hemorrhage involving the right frontal lobe, right basal ganglia, and right temporal lobe. 2. Progressive hemorrhage in the right occipital lobe. 3. Extensive effacement of the sulci over the right hemispheric and 6 mm of right-to-left midline shift. 4. Diffuse subarachnoid hemorrhage is now present. 5. Intraventricular hemorrhages noted with early dilation of the left temporal tip.  2017-11-13 IMPRESSION: 1. Small new multifocal areas of abnormal hyperdensity in the right hemisphere. Although some of these in the right MCA territory might reflect post  ischemic contrast staining, there is at least a small volume of acute hemorrhage, including that seen at the right occipital pole which is probably in the subarachnoid space. This was discussed in person with Dr. Milon Dikes on 11-13-17 at 1200 hours. 2. No intracranial mass effect. No new areas of cytotoxic edema in the right MCA territory. 3. New or increased left posterior scalp hematoma since 0902 hours today, 7 mm in thickness. No underlying skull fracture.   Cerebral angio 2017/11/13 IMPRESSION: Status post cerebral angiogram and successful mechanical thrombectomy of occluded right superior division M2 branch, with restoration of TICI 3 flow. Closure of right common femoral artery access with overlapping suture mediated devices.   MRI and MRA head  MRI head: 1. Stable large acute hemorrhage within right anterior temporal lobe and right frontal lobe. 2. Acute hemorrhage within right caudothalamic groove and right putamen are stable mildly increased in size given differences in technique. 3. Subarachnoid hemorrhage predominantly within sylvian fissures and right MCA cistern. 4. Intraventricular hemorrhage within occipital horns and right temporal horn of lateral ventricle. Stable ventricle size. 5. Acute infarct of right basal ganglia involving putamen and caudate body. Additional scattered acute cortical infarcts in right MCA distribution. 6. Stable mass effect with partial effacement of right lateral ventricle and 5 mm right-to-left midline shift. MRA head: 1. Several M2 branches extending to the right anterolateral frontal region appear occluded, the area is partially obscured by artifact from motion and the presence of hemorrhage within the MCA cistern. 2. Right cavernous ICA medially directed aneurysm measuring 2 mm to dome with 4 mm base. 3. Otherwise no large vessel occlusion, aneurysm, or significant stenosis is identified.  2D Echocardiogram  - LVEF 60-65%, normal wall  thickness, normal wall motion, mild AI,   mild to moderate MR, moderate biatrial enlargement, moderate TR,   RVSP 62 mmHg, dilated IVC.   PHYSICAL EXAM  Temp:  [97.6 F (36.4 C)-98.4 F (36.9 C)] 97.6 F (36.4 C) (11/06 0800) Pulse Rate:  [68-96] 72 (11/06 0900) Resp:  [11-30] 18 (11/06 0900) BP: (109-167)/(47-98) 139/71 (11/06 0900) SpO2:  [96 %-100 %] 100 % (11/06 0900) Arterial Line BP: (103-181)/(41-70) 181/70 (11/06 0900)  General - thin lady, well developed, eyes closed but easily arousable on voice  Ophthalmologic - fundi not visualized due to noncooperation.  Cardiovascular - irregularly irregular heart rate and rhythm.  Neuro - eyes closed but very easily arousable, able to follow central and  peripheral commands.  Eyes right gaze preference, able to cross midline, left gaze incomplete, not blinking to visual threat on the left, PERRL.  Left facial droop, tongue midline in mouth. RUE and RLE spontaneous movement, against gravity, follows commands on the right side. LUE and LLE no spontaneous movement, 0/5 on pain stimulation. DTR1+, Left Babinski positive.  Sensation, coordination and gait not tested.   ASSESSMENT/PLAN Ms. Lee Regino SchultzeM Adkins is a 83 y.o. female with history of HTN, DM, A. fib on Xarelto, UTI with recent fall pending for left hip surgery off Xarelto for 2 days admitted for acute onset left side weakness, facial droop, altered mental status. tPA given at [facility].   Stroke:  right BG infarct with right M1 occlusion s/p tPA resulting small right frontal and occipital SAH, underwent IR mechanical thrombectomy for right MCA TICI3 revascularization, but causing right frontal, temporal large hematomas with SAH and IVH  Resultant lethargic, left facial droop, left hemiplegia  CTA head and neck - right M1 occlusion with relatively good collateral circulation on the right hemisphere  CT head s/p tPA - hyperdensity right BG could be due to contrast standing,  however, small hyperdensity right frontal and occipital likely due to small SAH  DSA - right M1/M2 revascularization with TICI 3 reperfusion  CT head repeat s/p IR x 2 - right large frontal, temporal and occipital hematomas with SAH and small amount IVH  MRI right BG infarct with right BG, right frontal and right temporal and small right occipital hematoma as well as right sylvian fissure SAH and b/l lateral ventricle IVH  MRA right MCA distal branches missing  2D Echo EF 60-65%  LDL 79  HgbA1c 5.8  SCDs for VTE prophylaxis  Diet NPO time specified   Xarelto (rivaroxaban) daily off for 2 days prior to admission, now on No antithrombotic due to ICH  Will consider nimodipine for vasospasm when po access and BP allows.  Ongoing aggressive stroke risk factor management  Therapy recommendations:  Pending  Disposition: Pending  Cerebral edema  Midline shift stable on repeat CT and MRI  On 3% saline  Allow slow trending up due to baseline hyponatremia  Na 132->136->141->143->146->151  Hydrocephalus   Slight hydrocephalus on the left lateral ventricles  MRI no significant hydrocephalus  Close monitoring  No surgical intervention for now  Left scalp laceration, improved  S/p fall  Became purfusely bleeding after tPA  Compress dressing, off now  Stable, normal bleeding  Anemia requiring transfusion  Hb 6.5-> PRBC -> 9.0->8.6->7.7->7.4  Close monitoring  PRBC transfusion if Hb less than 7.0  Left femoral head fracuture  Ortho on board  If pt remains stable, may consider surgery at the end of this week  Keep bed rest for now  Anesthesia consultation for possible avoidance of general anesthesia  Ethics   no next of kin or blood relatives that are alive.    3 friends from church listed above, who are her social support system.    No medical power of attorney listed.  One of the friends Mr. Earlene Platerdwin Bridges, reported that when asked about  resuscitation and CODE STATUS, prior to admission at [facility] for the hip fracture, she said she would want to be resuscitated.  None of them could tell if she would like to be resuscitated knowing that she has large amount of brain bleeds and possibly poor chance of good functional outcome  Ethics recommend psychiatry consultation for capacity evaluation and in the seizure consultation for  possible avoidance of general seizure  Diabetes  HgbA1c 5.8 goal < 7.0  Controlled  CBG monitoring  SSI  Hypertension Stable, BP on the low side BP goal < 160 due to [facility] and ICH Cleviprex and labetalol as needed  Hyperlipidemia  Home meds:  none   LDL 79, goal < 70  No need to start statin at this time  Other Stroke Risk Factors  Advanced age  Other Active Problems  Hyponatremia  Thrombocytopenia   Hospital day # 2  This patient is critically ill due to right MCA infarct, s/p IR with SAH, IVH, and hematoma on the right hemisphere, afib and at significant risk of neurological worsening, death form cerebral edema, brain herniation, shock, heart failure, recurrent stroke and ICH expansion. This patient's care requires constant monitoring of vital signs, hemodynamics, respiratory and cardiac monitoring, review of multiple databases, neurological assessment, discussion with Dr. Juanita Craver from [facility] and medical decision making of high complexity. I spent 40 minutes of neurocritical care time in the care of this patient.  Marvel Plan, MD PhD Stroke Neurology 10/26/2017 11:08 AM    To contact Stroke Continuity provider, please refer to [email or web address]. After hours, contact General Neurology

## 2017-10-26 NOTE — Progress Notes (Signed)
OT Cancellation Note  Patient Details Name: Carol Parker MRN: [id number] DOB: September 21, 1929   Cancelled Treatment:    Reason Eval/Treat Not Completed: Patient not medically ready. Pt remains on strict bedrest and awaiting hip surgery potentially on Friday. Will check back as medically appropriate.  Carol Sectionharity A Haylei Cobin, MS OTR/L  Pager: [phone]   Carol Parker 10/26/2017, 7:07 AM

## 2017-10-27 ENCOUNTER — Inpatient Hospital Stay (HOSPITAL_COMMUNITY): Payer: Medicare Other

## 2017-10-27 DIAGNOSIS — I639 Cerebral infarction, unspecified: Secondary | ICD-10-CM

## 2017-10-27 DIAGNOSIS — R4189 Other symptoms and signs involving cognitive functions and awareness: Secondary | ICD-10-CM

## 2017-10-27 LAB — BASIC METABOLIC PANEL
BUN: 11 mg/dL (ref 6–20)
CALCIUM: 8.4 mg/dL — AB (ref 8.9–10.3)
CO2: 20 mmol/L — AB (ref 22–32)
Chloride: >130 mmol/L (ref 101–111)
Creatinine, Ser: 0.64 mg/dL (ref 0.44–1.00)
GFR calc Af Amer: >60 mL/min (ref >60)
GLUCOSE: 153 mg/dL — AB (ref 65–99)
Potassium: 3 mmol/L — ABNORMAL LOW (ref 3.5–5.1)
Sodium: 156 mmol/L — ABNORMAL HIGH (ref 135–145)

## 2017-10-27 LAB — SODIUM
SODIUM: 156 mmol/L — AB (ref 135–145)
Sodium: 155 mmol/L — ABNORMAL HIGH (ref 135–145)

## 2017-10-27 LAB — GLUCOSE, CAPILLARY
GLUCOSE-CAPILLARY: 88 mg/dL (ref 65–99)
GLUCOSE-CAPILLARY: 119 mg/dL — AB (ref 65–99)
Glucose-Capillary: 67 mg/dL (ref 65–99)

## 2017-10-27 LAB — CBC WITH DIFFERENTIAL/PLATELET
BASOS PCT: 0 %
Basophils Absolute: 0 10*3/uL (ref 0.0–0.1)
EOS PCT: 2 %
Eosinophils Absolute: 0.1 10*3/uL (ref 0.0–0.7)
HEMATOCRIT: 22.7 % — AB (ref 36.0–46.0)
Hemoglobin: 7.6 g/dL — ABNORMAL LOW (ref 12.0–15.0)
LYMPHS PCT: 13 %
Lymphs Abs: 0.6 10*3/uL — ABNORMAL LOW (ref 0.7–4.0)
MCH: 30.2 pg (ref 26.0–34.0)
MCHC: 33.5 g/dL (ref 30.0–36.0)
MCV: 90.1 fL (ref 78.0–100.0)
MONO ABS: 0.5 10*3/uL (ref 0.1–1.0)
MONOS PCT: 12 %
NEUTROS ABS: 3.1 10*3/uL (ref 1.7–7.7)
Neutrophils Relative %: 73 %
PLATELETS: 135 10*3/uL — AB (ref 150–400)
RBC: 2.52 MIL/uL — ABNORMAL LOW (ref 3.87–5.11)
RDW: 15.5 % (ref 11.5–15.5)
WBC: 4.3 10*3/uL (ref 4.0–10.5)

## 2017-10-27 LAB — MRSA PCR SCREENING: MRSA BY PCR: NEGATIVE

## 2017-10-27 LAB — PROCALCITONIN

## 2017-10-27 LAB — LACTIC ACID, PLASMA
LACTIC ACID, VENOUS: 1 mmol/L (ref 0.5–1.9)
LACTIC ACID, VENOUS: 0.9 mmol/L (ref 0.5–1.9)

## 2017-10-27 LAB — PHOSPHORUS
PHOSPHORUS: 1.7 mg/dL — AB (ref 2.5–4.6)
Phosphorus: 1.8 mg/dL — ABNORMAL LOW (ref 2.5–4.6)

## 2017-10-27 LAB — MAGNESIUM
MAGNESIUM: 1.8 mg/dL (ref 1.7–2.4)
MAGNESIUM: 1.6 mg/dL — AB (ref 1.7–2.4)

## 2017-10-27 MED ORDER — SODIUM CHLORIDE 0.9 % IV BOLUS (SEPSIS)
500.0000 mL | Freq: Once | INTRAVENOUS | Status: DC
  Administered 2017-10-27: 500 mL via INTRAVENOUS

## 2017-10-27 MED ORDER — PRO-STAT SUGAR FREE PO LIQD: 30.0000 mL | Freq: Two times a day (BID) | ORAL | Status: DC

## 2017-10-27 MED ORDER — SODIUM CHLORIDE 3 % IN NEBU
4.0000 mL | INHALATION_SOLUTION | Freq: Four times a day (QID) | RESPIRATORY_TRACT | Status: DC
  Administered 2017-10-27: 4 mL via RESPIRATORY_TRACT
  Filled 2017-10-27 (×2): qty 4

## 2017-10-27 MED ORDER — VITAL HIGH PROTEIN PO LIQD: 1000.0000 mL | ORAL | Status: DC

## 2017-10-27 MED ORDER — CHLORHEXIDINE GLUCONATE CLOTH 2 % EX PADS
6.0000 | MEDICATED_PAD | Freq: Every day | CUTANEOUS | Status: DC
  Administered 2017-10-27: 6 via TOPICAL

## 2017-10-27 MED ORDER — DILTIAZEM HCL 100 MG IV SOLR
5.0000 mg/h | INTRAVENOUS | Status: DC
  Filled 2017-10-27: qty 100

## 2017-10-27 MED ORDER — IPRATROPIUM BROMIDE 0.02 % IN SOLN
0.5000 mg | Freq: Three times a day (TID) | RESPIRATORY_TRACT | Status: DC
  Administered 2017-10-27 (×2): 0.5 mg via RESPIRATORY_TRACT
  Filled 2017-10-27 (×2): qty 2.5

## 2017-10-27 MED ORDER — ACETYLCYSTEINE 20 % IN SOLN
10.0000 mL | Freq: Once | RESPIRATORY_TRACT | Status: AC
  Administered 2017-10-27: 10 mL via RESPIRATORY_TRACT
  Filled 2017-10-27: qty 10
  Filled 2017-10-27: qty 12

## 2017-10-27 MED ORDER — METOPROLOL TARTRATE 50 MG PO TABS
50.0000 mg | ORAL_TABLET | Freq: Two times a day (BID) | ORAL | Status: DC
  Administered 2017-10-27: 50 mg via ORAL
  Filled 2017-10-27: qty 1

## 2017-10-27 MED ORDER — SODIUM CHLORIDE 0.9% FLUSH: 10.0000 mL | INTRAVENOUS | Status: DC | PRN

## 2017-10-27 MED ORDER — DEXTROSE 5 % IV SOLN
0.0000 ug/min | INTRAVENOUS | Status: DC
  Filled 2017-10-27: qty 4

## 2017-10-27 MED ORDER — POTASSIUM CHLORIDE 20 MEQ PO PACK
40.0000 meq | PACK | Freq: Once | ORAL | Status: AC
  Administered 2017-10-27: 40 meq via ORAL
  Filled 2017-10-27: qty 2

## 2017-10-27 MED ORDER — DOCUSATE SODIUM 50 MG/5ML PO LIQD: 100.0000 mg | Freq: Two times a day (BID) | ORAL | Status: DC | PRN

## 2017-10-27 MED ORDER — ALBUTEROL SULFATE (2.5 MG/3ML) 0.083% IN NEBU
2.5000 mg | INHALATION_SOLUTION | Freq: Four times a day (QID) | RESPIRATORY_TRACT | Status: DC | PRN
  Administered 2017-10-27: 2.5 mg via RESPIRATORY_TRACT
  Filled 2017-10-27: qty 3

## 2017-10-27 MED ORDER — POTASSIUM CHLORIDE 10 MEQ/100ML IV SOLN
10.0000 meq | INTRAVENOUS | Status: AC
  Administered 2017-10-27 (×2): 10 meq via INTRAVENOUS
  Filled 2017-10-27 (×2): qty 100

## 2017-10-27 MED ORDER — MORPHINE SULFATE 25 MG/ML IV SOLN
2.0000 mg/h | INTRAVENOUS | Status: DC
  Administered 2017-10-27: 2 mg/h via INTRAVENOUS
  Filled 2017-10-27: qty 10

## 2017-10-27 NOTE — Progress Notes (Signed)
PULMONARY / CRITICAL CARE MEDICINE   Name: Carol Parker MRN: [id number] DOB: 1929/04/07    ADMISSION DATE:  11/14/2017   CHIEF COMPLAINT:  Left hemiparesis  HISTORY OF PRESENT ILLNESS:   This is an 83 year old who normally takes Xeralto for chronic atrial fibrillation.  She fell on 11/2 and fractured her left hip.  Xarelto was held anticipating surgery on her hip but on 11/4 she developed a left hemiparesis.  She was given TPA and transferred to this hospital where a thrombectomy for a right MCA occlusion was performed.  Following the thrombectomy her hemoglobin had dropped substantially and she received 2 units of packed red blood cells and the TPA was reversed with cryoprecipitate.  Follow-up CT scan on 11/5 has shown some mass-effect from her stroke with a 4.6 mm midline shift.  3% saline is in place. This morning she is more alert for me. She complains only of left hip pain.   PAST MEDICAL HISTORY :  DM, atrial fibrillation  SUBJECTIVE:  Worsening respiratory failure overnight requiring intubation.  Suspect aspiration pneumonia  VITAL SIGNS: BP (!) 157/64   Pulse (!) 101   Temp 98 F (36.7 C) (Axillary)   Resp 16   Ht 5\' 7"  (1.702 m)   Wt 69.2 kg (152 lb 8.9 oz)   SpO2 98%   BMI 23.89 kg/m     INTAKE / OUTPUT: I/O last 3 completed shifts: In: 1998.1 [I.V.:1643.4; IV Piggyback:354.7] Out: 2625 [Urine:2625]  PHYSICAL EXAMINATION: General: Chronically ill appearing elderly female in NAD Neuro: Sedated on low-dose fentanyl drip, RA SS -1, follow some commands Left facial droop. Pupils equal Left hemiparesis.  Cardiovascular:  s1s2 rrr, no JVD  Lungs: Respirations even and nonlabored on vent, diminished left, scattered rhonchi Abdomen: Soft without any organomegaly masses tenderness guarding or rebound.  Right groin access site is dry without hematoma. Musculoskeletal: She has some slight external rotation of the left foot.  No significant ecchymoses over the left  hip.   LABS:  BMET Recent Labs  Lab 10/25/17 0510  10/26/17 0133  10/26/17 1944 10/27/17 0145 10/27/17 0649  NA 136   < > 146*   < > 155* 156* 155*  K 4.1  --  3.3*  --   --  3.0*  --   CL 108  --  123*  --   --  >130*  --   CO2 18*  --  20*  --   --  20*  --   BUN 13  --  15  --   --  11  --   CREATININE 0.71  --  0.69  --   --  0.64  --   GLUCOSE 124*  --  129*  --   --  153*  --    < > = values in this interval not displayed.    Electrolytes Recent Labs  Lab 10/25/17 0510 10/26/17 0133 10/27/17 0145  CALCIUM 7.8* 8.0* 8.4*  MG 2.5* 2.0 1.8  PHOS 2.7 1.6* 1.7*    CBC Recent Labs  Lab 10/25/17 2158 10/26/17 0133 10/27/17 0145  WBC 5.7 5.0 4.3  HGB 7.6* 7.4* 7.6*  HCT 22.0* 21.7* 22.7*  PLT 96* 102* 135*    Coag's Recent Labs  Lab 02-08-17 2102 10/26/17 0133  INR 1.38 1.25    Sepsis Markers Recent Labs  Lab 10/26/17 2221 10/27/17 0145  LATICACIDVEN 1.0 0.9  PROCALCITON <0.10 <0.10    ABG Recent Labs  Lab 02-08-17  2117 10/26/17 2140 10/26/17 2315  PHART 7.366 7.399 7.374  PCO2ART 34.9 32.1 33.3  PO2ART 185.0* 89.8 160*    Liver Enzymes No results for input(s): AST, ALT, ALKPHOS, BILITOT, ALBUMIN in the last 168 hours.  Cardiac Enzymes No results for input(s): TROPONINI, PROBNP in the last 168 hours.  Glucose Recent Labs  Lab 10/25/17 2019 10/26/17 0734 10/26/17 1106 10/26/17 1737 10/26/17 2125 10/27/17 0805  GLUCAP 118* 114* 136* 108* 136* 88    Imaging Dg Chest Port 1 View  Result Date: 10/27/2017 CLINICAL DATA:  Respiratory failure with hypoxia EXAM: PORTABLE CHEST 1 VIEW COMPARISON:  October 26, 2017 FINDINGS: Endotracheal tube tip is now 1.8 cm above the carina. Central catheter tip is in the superior vena cava. Nasogastric tube tip is below the diaphragm with the side port in the proximal stomach region. No pneumothorax. There is persistent consolidation throughout the left lung with volume loss. There may be ease a  degree of effusions superimposed on the left. There is shift of heart and mediastinum toward the left. Right lung is hyperexpanded but clear. The heart size is within normal limits. Pulmonary vascularity on the right appears unremarkable. Pulmonary vascular on the left is obscured. No bone lesions. No adenopathy in regions that can be interrogated. IMPRESSION: Tube and catheter positions as described without pneumothorax. Consolidation with volume loss on the left with shift of heart and mediastinum toward the left. This finding raises concern for potential mucous plugging on the left causing this degree of volume loss. Right lung is hyperexpanded clear. Cardiac silhouette grossly within normal limits and stable. These results will be called to the ordering clinician or representative by the Radiologist Assistant, and communication documented in the PACS or zVision Dashboard. Electronically Signed   By: Bretta Bang III M.D.   On: 10/27/2017 08:00   Dg Chest Port 1 View  Result Date: 10/26/2017 CLINICAL DATA:  83 y/o F; post intubation and enteric tube placement. EXAM: PORTABLE CHEST 1 VIEW COMPARISON:  10/26/2017 chest radiograph. FINDINGS: Endotracheal tube tip is in the right mainstem bronchus. Increased opacification of the left lung likely representing worsening atelectasis. Stable large left effusion. Interval leftward mediastinal shift likely due to left lung atelectasis. Clear right lung. No acute osseous abnormality is evident. Enteric tube tip extends below the field of view in the abdomen. Left central venous catheter tip projects over the lower SVC. IMPRESSION: 1. Endotracheal tube tip in the origin of right mainstem bronchus, retraction recommended. 2. Increasing opacification of left hemithorax likely representing worsening atelectasis of the lung. Stable large left effusion. These results will be called to the ordering clinician or representative by the Radiologist Assistant, and  communication documented in the PACS or zVision Dashboard. Electronically Signed   By: Mitzi Hansen M.D.   On: 10/26/2017 22:32   Dg Chest Port 1 View  Result Date: 10/26/2017 CLINICAL DATA:  Hypoxia EXAM: PORTABLE CHEST 1 VIEW COMPARISON:  Chest radiograph 10/25/2017 FINDINGS: There is complete collapse of the left lower lobe with leftward mediastinal shift. Suspected left pleural effusion. Decreased aeration of the left upper lobe. Left subclavian central venous catheter tip is at the cavoatrial junction. The right lung is clear. IMPRESSION: Complete collapse of the left lower lobe with associated left hemithoracic volume loss. This may be secondary to mucous plugging. Electronically Signed   By: Deatra Robinson M.D.   On: 10/26/2017 21:57     STUDIES:  MRI brain 11/5>>> 1. Stable large acute hemorrhage within right anterior temporal  lobe and right frontal lobe. 2. Acute hemorrhage within right caudothalamic groove and right putamen are stable mildly increased in size given differences in technique. 3. Subarachnoid hemorrhage predominantly within sylvian fissures and right MCA cistern. 4. Intraventricular hemorrhage within occipital horns and right temporal horn of lateral ventricle. Stable ventricle size. 5. Acute infarct of right basal ganglia involving putamen and caudate body. Additional scattered acute cortical infarcts in right MCA distribution. 6. Stable mass effect with partial effacement of right lateral ventricle and 5 mm right-to-left midline shift.  CULTURES: Sputum 11/6>>>  ANTIBIOTICS: Levquin 11/6>>> Aztreonam 11/6>>>  SIGNIFICANT EVENTS:   LINES/TUBES: ETT 11/6>>>  DISCUSSION: 83 year old type II diabetic with chronic atrial fibrillation who suffered from a fractured hip on 11/ 2.  She developed left hemiparesis on 11/4, received TPA and subsequently had a MCA thrombectomy. She has been receiving 3% saline and mental status had been steadily  improving.  However overnight on 11 6 she developed worsening respiratory failure likely related to aspiration pneumonia and required intubation.  ASSESSMENT / PLAN:  PULMONARY  Acute respiratory failure secondary to aspiration pneumonia Mucous plugging/left-sided collapse PLAN- Vent support - 8cc/kg  F/u CXR  F/u ABG Antibiotics as above Sputum culture pending Aggressive pulmonary hygiene Consider addition of Mucomyst Follow-up chest x-ray this afternoon  CARDIOVASCULAR AFib  HTN  PLAN -  We will add low-dose beta-blocker for hypertension and rate control Hold off on systemic anticoagulation for now  GASTROINTESTINAL PLAN- We will add tube feed today PPI  HEMATOLOGIC No active issues.  She does have a large potential space to bleed into her hip especially given TPA administration. PLAN -  Hold off on systemic anticoagulation for now Follow-up CBC SCDs   NEUROLOGIC  significant right MCA territory infarct with midline shift PLAN -  Per neurology who is primary 3% saline on hold this a.m. Continue low-dose fentanyl drip    Per RN patient has no family and is currently unable to make any decisions for herself.  She does have close friends who have been involved in her care.  We will update them when they are available  Dirk DressKaty Alvah Lagrow, NP 10/27/2017  10:12 AM Pager: [phone] or [phone]

## 2017-10-27 NOTE — Progress Notes (Signed)
STROKE TEAM PROGRESS NOTE   SUBJECTIVE (INTERVAL HISTORY) Her RN is at the bedside.  Her condition worsen, intubated overnight due to respiratory failure. Had psychiatry consult and deemed patient not having capacity for making her own decisions. This morning, pt still intubated with afib RVR and will put back pt metoprolol for rate control. Will Investment banker, operational for decision maker.    OBJECTIVE Temp:  [97.7 F (36.5 C)-98.7 F (37.1 C)] 98.3 F (36.8 C) (11/07 1200) Pulse Rate:  [27-113] 27 (11/07 1020) Cardiac Rhythm: Atrial fibrillation (11/07 0700) Resp:  [14-33] 21 (11/07 1140) BP: (108-157)/(54-82) 114/62 (11/07 1100) SpO2:  [86 %-100 %] 92 % (11/07 1250) Arterial Line BP: (136-187)/(63-82) 136/65 (11/07 1140) FiO2 (%):  [30 %-100 %] 30 % (11/07 1250)  Recent Labs  Lab 10/26/17 1737 10/26/17 2125 10/27/17 0805 10/27/17 1200 10/27/17 1251  GLUCAP 108* 136* 88 67 119*   Recent Labs  Lab 2017/11/22 1504  11/22/2017 2102  10/25/17 0510  10/26/17 0133  10/26/17 1254 10/26/17 1944 10/27/17 0145 10/27/17 0649 10/27/17 1249 10/27/17 1250  NA 135  --  132*   < > 136   < > 146*   < > 151* 155* 156* 155*  --  156*  K 3.1*  --  3.6  --  4.1  --  3.3*  --   --   --  3.0*  --   --   --   CL  --   --  106  --  108  --  123*  --   --   --  >130*  --   --   --   CO2  --   --  19*  --  18*  --  20*  --   --   --  20*  --   --   --   GLUCOSE  --   --  144*  --  124*  --  129*  --   --   --  153*  --   --   --   BUN  --   --  13  --  13  --  15  --   --   --  11  --   --   --   CREATININE  --   --  0.71  --  0.71  --  0.69  --   --   --  0.64  --   --   --   CALCIUM  --    < > 7.6*  --  7.8*  --  8.0*  --   --   --  8.4*  --   --   --   MG  --   --  1.3*  --  2.5*  --  2.0  --   --   --  1.8  --  1.6*  --   PHOS  --   --  3.2  --  2.7  --  1.6*  --   --   --  1.7*  --  1.8*  --    < > = values in this interval not displayed.   No results for input(s): AST, ALT, ALKPHOS,  BILITOT, PROT, ALBUMIN in the last 168 hours. Recent Labs  Lab 10/25/17 1018 10/25/17 1525 10/25/17 2158 10/26/17 0133 10/27/17 0145  WBC 6.2 6.0 5.7 5.0 4.3  NEUTROABS 4.9 4.7 4.2 3.6 3.1  HGB 7.7* 7.7* 7.6* 7.4* 7.6*  HCT 22.2* 22.4*  22.0* 21.7* 22.7*  MCV 86.7 86.5 87.3 87.9 90.1  PLT 84* 95* 96* 102* 135*   No results for input(s): CKTOTAL, CKMB, CKMBINDEX, TROPONINI in the last 168 hours. Recent Labs    11/04/2017 2102 10/26/17 0133  LABPROT 16.9* 15.6*  INR 1.38 1.25   No results for input(s): COLORURINE, LABSPEC, PHURINE, GLUCOSEU, HGBUR, BILIRUBINUR, KETONESUR, PROTEINUR, UROBILINOGEN, NITRITE, LEUKOCYTESUR in the last 72 hours.  Invalid input(s): APPERANCEUR     Component Value Date/Time   CHOL 108 10/25/2017 0510   TRIG 79 10/25/2017 0510   HDL 41 10/25/2017 0510   CHOLHDL 2.6 10/25/2017 0510   VLDL 16 10/25/2017 0510   LDLCALC 51 10/25/2017 0510   Lab Results  Component Value Date   HGBA1C 5.8 (H) 10/25/2017   No results found for: LABOPIA, COCAINSCRNUR, LABBENZ, AMPHETMU, THCU, LABBARB  No results for input(s): ETH in the last 168 hours.  I have personally reviewed the radiological images below and agree with the radiology interpretations.  Ct Head Wo Contrast 10/25/2017 IMPRESSION: 1. Multifocal intraparenchymal hemorrhage, some of which appear smaller most compatible with a component of resolving contrast staining. 2. Increasing intraventricular blood products with mild LEFT ventricle entrapment. 5 mm RIGHT to LEFT midline shift, decreased from 7 mm. 3. Small volume scattered subarachnoid hemorrhage. 4. Moderate LEFT parietoccipital scalp hematoma.   11/18/2017 IMPRESSION: 1. Multiple sites of parenchymal hemorrhage involving the right frontal lobe, right basal ganglia, and right temporal lobe. 2. Progressive hemorrhage in the right occipital lobe. 3. Extensive effacement of the sulci over the right hemispheric and 6 mm of right-to-left midline shift. 4.  Diffuse subarachnoid hemorrhage is now present. 5. Intraventricular hemorrhages noted with early dilation of the left temporal tip.  11/02/2017 IMPRESSION: 1. Small new multifocal areas of abnormal hyperdensity in the right hemisphere. Although some of these in the right MCA territory might reflect post ischemic contrast staining, there is at least a small volume of acute hemorrhage, including that seen at the right occipital pole which is probably in the subarachnoid space. This was discussed in person with Dr. Milon DikesASHISH ARORA on 11/15/2017 at 1200 hours. 2. No intracranial mass effect. No new areas of cytotoxic edema in the right MCA territory. 3. New or increased left posterior scalp hematoma since 0902 hours today, 7 mm in thickness. No underlying skull fracture.   Cerebral angio 11/15/2017 IMPRESSION: Status post cerebral angiogram and successful mechanical thrombectomy of occluded right superior division M2 branch, with restoration of TICI 3 flow. Closure of right common femoral artery access with overlapping suture mediated devices.   MRI and MRA head  MRI head: 1. Stable large acute hemorrhage within right anterior temporal lobe and right frontal lobe. 2. Acute hemorrhage within right caudothalamic groove and right putamen are stable mildly increased in size given differences in technique. 3. Subarachnoid hemorrhage predominantly within sylvian fissures and right MCA cistern. 4. Intraventricular hemorrhage within occipital horns and right temporal horn of lateral ventricle. Stable ventricle size. 5. Acute infarct of right basal ganglia involving putamen and caudate body. Additional scattered acute cortical infarcts in right MCA distribution. 6. Stable mass effect with partial effacement of right lateral ventricle and 5 mm right-to-left midline shift. MRA head: 1. Several M2 branches extending to the right anterolateral frontal region appear occluded, the area is partially obscured by  artifact from motion and the presence of hemorrhage within the MCA cistern. 2. Right cavernous ICA medially directed aneurysm measuring 2 mm to dome with 4 mm base. 3. Otherwise no  large vessel occlusion, aneurysm, or significant stenosis is identified.  2D Echocardiogram  - LVEF 60-65%, normal wall thickness, normal wall motion, mild AI,   mild to moderate MR, moderate biatrial enlargement, moderate TR,   RVSP 62 mmHg, dilated IVC.   PHYSICAL EXAM  Temp:  [97.7 F (36.5 C)-98.7 F (37.1 C)] 98.3 F (36.8 C) (11/07 1200) Pulse Rate:  [27-113] 27 (11/07 1020) Resp:  [14-33] 21 (11/07 1140) BP: (108-157)/(54-82) 114/62 (11/07 1100) SpO2:  [86 %-100 %] 92 % (11/07 1250) Arterial Line BP: (136-187)/(63-82) 136/65 (11/07 1140) FiO2 (%):  [30 %-100 %] 30 % (11/07 1250)  General - thin lady, well developed, intubated  Ophthalmologic - fundi not visualized due to noncooperation.  Cardiovascular - irregularly irregular heart rate and rhythm with RVR  Neuro - eyes closed, intubated and not responsive on fentanyl, eyes midline, no doll's eyes, weak corneal and positive gag.  Left facial droop, ET tube in place. RUE and RLE mild withdraw on pain stimulation. LUE and LLE 0/5 on pain stimulation. DTR1+, no Babinski.  Sensation, coordination and gait not tested.   ASSESSMENT/PLAN Ms. Carol Parker is a 83 y.o. female with history of HTN, DM, A. fib on Xarelto, UTI with recent fall pending for left hip surgery off Xarelto for 2 days admitted for acute onset left side weakness, facial droop, altered mental status. [facility]   Stroke:  right BG infarct with right M1 occlusion s/p tPA resulting small right frontal and occipital SAH, underwent IR mechanical thrombectomy for right MCA TICI3 revascularization, but causing right frontal, temporal large hematomas with SAH and IVH  Resultant lethargic, left facial droop, left hemiplegia  CTA head and neck - right M1 occlusion  with relatively good collateral circulation on the right hemisphere  CT head s/p tPA - hyperdensity right BG could be due to contrast standing, however, small hyperdensity right frontal and occipital likely due to small SAH  DSA - right M1/M2 revascularization with TICI 3 reperfusion  CT head repeat s/p IR x 2 - right large frontal, temporal and occipital hematomas with SAH and small amount IVH  MRI right BG infarct with right BG, right frontal and right temporal and small right occipital hematoma as well as right sylvian fissure SAH and b/l lateral ventricle IVH  MRA right MCA distal branches missing  2D Echo EF 60-65%  LDL 79  HgbA1c 5.8  SCDs for VTE prophylaxis  Diet NPO time specified   Xarelto (rivaroxaban) daily off for 2 days prior to admission, now on No antithrombotic due to ICH  Therapy recommendations:  Pending  Disposition: Pending  Respiratory failure  CXR showed persistent left lower lobe pneumonia  Intubated for airway protection  Vent support per CCM  Cerebral edema  Midline shift stable on repeat CT and MRI  3% saline on hold now  Na 132->136->141->143->146->151->156  Continue Na monitoring  Hydrocephalus   Slight hydrocephalus on the left lateral ventricles  MRI no significant hydrocephalus  Close monitoring  No surgical intervention for now  Left scalp laceration, improved  S/p fall  Became purfusely bleeding after tPA  Compress dressing, off now  Stable, normal bleeding  Anemia requiring transfusion  Hb 6.5-> PRBC -> 9.0->8.6->7.7->7.4->7.6  Close monitoring  PRBC transfusion if Hb less than 7.0  Left femoral head fracuture  Ortho on board  If pt remains stable, may consider surgery at the end of this week  Procedure is for pain relieve only  Keep bed rest  for now  Ethics   no next of kin or blood relatives that are alive.    3 friends from church listed above, who are her social support system.    No  medical power of attorney listed.  Psych consult deemed pt no capacity for her own decisions  Contact ethic committee for decision maker at this time.  Diabetes  HgbA1c 5.8 goal < 7.0  Controlled  CBG monitoring  SSI  Hypertension Stable, BP on the low side BP goal < 160 due to [facility] and ICH Cleviprex and labetalol as needed  Hyperlipidemia  Home meds:  none   LDL 79, goal < 70  No need to start statin at this time  Other Stroke Risk Factors  Advanced age  Other Active Problems  Hyponatremia  Thrombocytopenia   Hospital day # 3  This patient is critically ill due to right MCA infarct, s/p IR with SAH, IVH, and hematoma on the right hemisphere, afib and at significant risk of neurological worsening, death form cerebral edema, brain herniation, shock, heart failure, recurrent stroke and ICH expansion. This patient's care requires constant monitoring of vital signs, hemodynamics, respiratory and cardiac monitoring, review of multiple databases, neurological assessment, discussion with Dr. Juanita Craver from CCM and medical decision making of high complexity. I spent 45 minutes of neurocritical care time in the care of this patient.  Marvel Plan, MD PhD Stroke Neurology 10/27/2017 2:03 PM    To contact Stroke Continuity provider, please refer to [email or web address]. After hours, contact General Neurology

## 2017-10-27 NOTE — Progress Notes (Signed)
CRITICAL VALUE ALERT  Critical Value:  Chloride >130, Sodium 156  Date & Time Notied:  10/27/17 0245  Provider Notified: Dr. Otelia LimesLindzen  Orders Received/Actions taken: Holding Hypertonic Saline per Dr. Otelia LimesLindzen.

## 2017-10-27 NOTE — Plan of Care (Signed)
Pt condition worsened this pm. Agonal breathing on vent, BP down to 70s and HR up to 140s. Ordered levophed and cardizem. Discussed with ethic committee earlier today and decided that her friends can be her decision maker in this situation. Discussed with Corrie DandyMary over the phone regarding the poor prognosis and life threatening situation and she also discussed with other 2 friends, and they decided that for pt best interest will proceed with full comfort care. Dr. Jillyn HiddenGary informed and will extubate and initiate full comfort care.   Marvel PlanJindong Ardyn Forge, MD PhD Stroke Neurology 10/27/2017 2:34 PM

## 2017-10-27 NOTE — Progress Notes (Signed)
Palliative care progress note  Palliative medicine consult was received.  Chart reviewed and note that patient has been transition to full comfort care and extubated this afternoon.  I spoke with bedside nurse as well as friends (her pastor and lifelong friend) that were at the bedside.  Her friends report that she has been receiving excellent care, goal moving forward is strictly for comfort, and they they have no concerns about her needs for comfort being met currently.  On examination she is unresponsive and lying in bed with nonlabored breathing.  She appears comfortable on my exam.  Provided anticipatory guidance on what to expect moving forward. As patient has already been transition to comfort care and appears comfortable on exam, will hold on formal consult at this time.  I will check in on her tomorrow (if she survives the night) and am happy to continue to assist in any way possible in the care of Carol Parker.  Please let me know if there are further concerns which I can be of assistance.  Micheline Rough, MD [place] Team [phone]  No charge note

## 2017-10-27 NOTE — Progress Notes (Signed)
Pt in AF with RVR despite addition of schedulted BB and prn BB. Discussed with Dr. Wallace CullensGray. Orders received.

## 2017-10-27 NOTE — Progress Notes (Addendum)
Pt respirations look labored, HR 130s, and now hypotensive. RT at bedside, MDs paged

## 2017-10-27 NOTE — Procedures (Signed)
Extubation Procedure Note  Patient Details:   Name: Loretha BrasilSusie M Roudebush DOB: 1929-08-27 MRN: [id number]   Pt extubated per MD order. Family at bedside.   Cherylin MylarDoyle, Camelle Henkels 10/27/2017, 4:09 PM

## 2017-10-27 NOTE — Progress Notes (Signed)
Patient ID: Carol Parker, female   DOB: August 20, 1929, 83 y.o.   MRN: [id number]    Referring Physician(s): Dr. Milon Dikes  Supervising Physician: Gilmer Mor  Patient Status: [facility] - In-pt  Chief Complaint: CVA  Subjective: Intubated overnight due to inability to protect airway.  Per RN, neuro exam was stable and without changes.  Allergies: Penicillins  Medications: Prior to Admission medications   Medication Sig Start Date End Date Taking? Authorizing Provider  albuterol (PROVENTIL HFA;VENTOLIN HFA) 108 (90 Base) MCG/ACT inhaler Inhale 2 puffs every 4 (four) hours as needed into the lungs for wheezing or shortness of breath.    Yes [provider]  calcium carbonate (CALCIUM 600) 1500 (600 Ca) MG TABS tablet Take 600 mg of elemental calcium 2 (two) times daily with a meal by mouth.   Yes [provider]  Cyanocobalamin (VITAMIN B 12 PO) Take 2,500 mcg daily by mouth.   Yes [provider]  losartan (COZAAR) 50 MG tablet Take 50 mg daily by mouth.   Yes [provider]  metFORMIN (GLUCOPHAGE-XR) 500 MG 24 hr tablet Take 500 mg daily by mouth.   Yes [provider]  metoprolol succinate (TOPROL-XL) 50 MG 24 hr tablet Take 50 mg daily by mouth. Take with or immediately following a meal.   Yes [provider]  Rivaroxaban (XARELTO) 15 MG TABS tablet Take 15 mg daily by mouth.    Yes [provider]  terbinafine (LAMISIL) 1 % cream Apply 1 application topically 2 (two) times daily. 02/04/17  Yes Stover, Titorya, DPM  terbinafine (LAMISIL) 250 MG tablet Take 250 mg every other day by mouth.   Yes [provider]  triamcinolone cream (KENALOG) 0.5 % Apply 1 application See admin instructions topically. Filled 07/19/17: apply topically to feet three times daily for 3 weeks, stop for 1 week, then repeat process   Yes [provider]  lidocaine (XYLOCAINE) 5 % ointment Apply 1 application topically 4 (four) times  daily as needed. Patient not taking: Reported on 10/25/2017 12/02/16   Asencion Islam, DPM    Vital Signs: BP 114/62   Pulse (!) 27   Temp 98 F (36.7 C) (Axillary)   Resp (!) 21   Ht 5\' 7"  (1.702 m)   Wt 152 lb 8.9 oz (69.2 kg)   SpO2 98%   BMI 23.89 kg/m   Physical Exam: Neuro: sedated, does not arouse to voice Skin: Groin intact.  Soft.  No evidence of hematoma or pseudoaneurysm  Imaging: Ct Head Wo Contrast  Result Date: 10/25/2017 CLINICAL DATA:  Altered level of consciousness. History of RIGHT ICA occlusion, mechanical thrombectomy of the M2 occlusion. EXAM: CT HEAD WITHOUT CONTRAST TECHNIQUE: Contiguous axial images were obtained from the base of the skull through the vertex without intravenous contrast. COMPARISON:  Multiple CT October 24, 2017 back or FINDINGS: BRAIN: Evolving RIGHT frontal lobe intraparenchymal hematoma with marginal a hazy density. Smaller RIGHT basal ganglia hemorrhage, less conspicuous than prior CT. Small similar RIGHT occipital lobe hemorrhage. Smaller RIGHT temporal lobe indistinct hemorrhage. Subcentimeter LEFT insular hemorrhage versus subarachnoid blood products. Increased intraventricular blood products. 5 mm RIGHT to LEFT midline shift, decreased from 7 mm. Mild similar LEFT ventricular entrapment. Subarachnoid hemorrhage RIGHT basal cistern. Mild RIGHT uncal herniation. VASCULAR: Mild calcific atherosclerosis of the carotid siphons. SKULL: No skull fracture. Moderate LEFT parietoccipital scalp hematoma. SINUSES/ORBITS: Severe chronic pan paranasal sinusitis. Mastoid air cells are well aerated.The included ocular globes and orbital contents are non-suspicious. OTHER:  None. IMPRESSION: 1. Multifocal intraparenchymal hemorrhage, some of which appear smaller most compatible with a component of resolving contrast staining. 2. Increasing intraventricular blood products with mild LEFT ventricle entrapment. 5 mm RIGHT to LEFT midline shift, decreased from 7 mm.  3. Small volume scattered subarachnoid hemorrhage. 4. Moderate LEFT parietoccipital scalp hematoma. Electronically Signed   By: Awilda Metro M.D.   On: 10/25/2017 02:03   Ct Head Wo Contrast  Result Date: 10/29/2017 CLINICAL DATA:  Status post revascularization of the right middle cerebral artery. EXAM: CT HEAD WITHOUT CONTRAST TECHNIQUE: Contiguous axial images were obtained from the base of the skull through the vertex without intravenous contrast. COMPARISON:  CT head without contrast from the same day at 11:50 a.m. FINDINGS: Brain: There significant increase in size and number of multiple foci of parenchymal hemorrhage. The largest is in the right frontal lobe, measuring 3.6 x 3.8 x 6.0 cm. There is hemorrhage into the right basal ganglia and anterior limb internal capsule. Right occipital lobe hemorrhages noted. Hemorrhage in the medial right temporal lobe measures 3.4 x 2.5 x 4.8 cm. Diffuse subarachnoid hemorrhage is present involving the suprasellar cisterns and along the tentorium. Minimal blood is noted within the ventricles, posteriorly on the left. There is some dilation of the left temporal tip suggesting early hydrocephalus. The para mesencephalic cisterns are opacified. Right-to-left midline shift measures 6.5 mm. Vascular: Atherosclerotic calcifications are present within the cavernous internal carotid artery is bilaterally. There is no residual hyperdense vessel. Skull: Calvarium is intact. Sinuses/Orbits: Chronic sinus disease is again seen. Bilateral lens replacements are noted. IMPRESSION: 1. Multiple sites of parenchymal hemorrhage involving the right frontal lobe, right basal ganglia, and right temporal lobe. 2. Progressive hemorrhage in the right occipital lobe. 3. Extensive effacement of the sulci over the right hemispheric and 6 mm of right-to-left midline shift. 4. Diffuse subarachnoid hemorrhage is now present. 5. Intraventricular hemorrhages noted with early dilation of the left  temporal tip. Critical Value/emergent results were called by telephone at the time of interpretation on 10/23/2017 at 4:36 pm to Dr. Gilmer Mor , who verbally acknowledged these results. Electronically Signed   By: Marin Roberts M.D.   On: 11/04/2017 16:38   Ct Head Wo Contrast  Result Date: 11/07/2017 CLINICAL DATA:  83 year old female transferred from [facility] for emergent large vessel occlusion, right ICA terminus and right MCA. Now status post IV tPA, and transfer to [facility]. EXAM: CT HEAD WITHOUT CONTRAST TECHNIQUE: Contiguous axial images were obtained from the base of the skull through the vertex without intravenous contrast. COMPARISON:  CTA head and neck and noncontrast head CT [facility] 0903 hours today. FINDINGS: Brain: Along the medial aspect of the right lentiform hypodensity seen earlier today there is now crescent-shaped and other scattered abnormal hyperdensity versus enhancement. There is more curvilinear abnormal hyperdensity or enhancement in the left inferior frontal gyrus (series 3, image 16). There is more confluent hyperdensity or enhancement in the mesial right temporal lobe (image 14) and also new curvilinear hyperdensity also at the right occipital pole (same image). There is also trace curvilinear hyperdensity along the more superior right frontal convexity (image 22). No associated mass effect. No intraventricular hemorrhage. No other extra-axial hemorrhage. No new areas of cytotoxic edema identified. Left hemisphere and posterior fossa gray-white matter differentiation is stable. Vascular: Residual intravascular contrast is present. Calcified atherosclerosis at the skull base. Skull: No acute osseous abnormality identified. Sinuses/Orbits: Stable chronic sinusitis findings. Tympanic cavities and mastoids remain clear. Other: The  patient has a broad-based left posterior convexity scalp hematoma measuring up to 7 mm in thickness which is new or  increased from the CTA head earlier today (series 4, image 35). The underlying calvarium is intact. Other scalp soft tissues appear stable and within normal limits. No acute orbit soft tissue findings. IMPRESSION: 1. Small new multifocal areas of abnormal hyperdensity in the right hemisphere. Although some of these in the right MCA territory might reflect post ischemic contrast staining, there is at least a small volume of acute hemorrhage, including that seen at the right occipital pole which is probably in the subarachnoid space. This was discussed in person with Dr. Milon Dikes on 11/11/2017 at 1200 hours. 2. No intracranial mass effect. No new areas of cytotoxic edema in the right MCA territory. 3. New or increased left posterior scalp hematoma since 0902 hours today, 7 mm in thickness. No underlying skull fracture. Electronically Signed   By: Odessa Fleming M.D.   On: 10/23/2017 12:44   Mr Maxine Glenn Head Wo Contrast  Result Date: 10/25/2017 CLINICAL DATA:  83 y/o  F; stroke follow-up. EXAM: MRI HEAD WITHOUT CONTRAST MRA HEAD WITHOUT CONTRAST TECHNIQUE: Multiplanar, multiecho pulse sequences of the brain and surrounding structures were obtained without intravenous contrast. Angiographic images of the head were obtained using MRA technique without contrast. COMPARISON:  10/25/2017 CT head. FINDINGS: MRI HEAD FINDINGS Brain: Multiple acute parenchymal hemorrhage with intermediate T1 and hypo/intermediate T2 signal. Large hematoma within the right anterior temporal lobe and the right frontal lobe are grossly stable from prior CT of head given differences in technique. Hemorrhage within the caudothalamic groove and the right putamen are stable to mildly increased in size. Surrounding T2 FLAIR signal abnormality is compatible with vasogenic edema and resultant mass effect partially effaces the right lateral ventricle and results in 5 mm right to left midline shift when measured in a similar fashion (series 7, image 18).  Subarachnoid hemorrhage is present predominantly within the sylvian fissures and overlying the cerebellum and there is intraventricular hemorrhage pooling within occipital horns and extending into the right lateral ventricle. Ventricle size is stable. No downward herniation. Area of reduced diffusion within the right lentiform nucleus extending into caudate body and corona radiata compatible with acute infarction. There are several additional small cortical infarctions within the right MCA distribution. Vascular: As below. Skull and upper cervical spine: Normal marrow signal. Sinuses/Orbits: No abnormal signal of mastoid air cells. Bilateral intra-ocular lens replacement. Extensive paranasal sinus disease the right ethmoid and maxillary sinus opacification. Other: None. MRA HEAD FINDINGS Internal carotid arteries: Right cavernous ICA medially directed aneurysm measuring 2 mm to dome with 4 mm base (series 4, image 103). Anterior cerebral arteries:  Patent. Middle cerebral arteries: Patent left M1 and distal circulation. Patent right M1 and proximal M2 branches. Several M2 branches extending to the right anterolateral frontal region appear occluded, the area is partially obscured by artifact from motion and the presence of hemorrhage within the MCA cistern. Anterior communicating artery: Patent. Posterior communicating arteries:  Probable diminutive bilaterally. Posterior cerebral arteries:  Patent. Basilar artery:  Patent. Vertebral arteries:  Patent. IMPRESSION: MRI head: 1. Stable large acute hemorrhage within right anterior temporal lobe and right frontal lobe. 2. Acute hemorrhage within right caudothalamic groove and right putamen are stable mildly increased in size given differences in technique. 3. Subarachnoid hemorrhage predominantly within sylvian fissures and right MCA cistern. 4. Intraventricular hemorrhage within occipital horns and right temporal horn of lateral ventricle. Stable ventricle size. 5.  Acute infarct of right basal ganglia involving putamen and caudate body. Additional scattered acute cortical infarcts in right MCA distribution. 6. Stable mass effect with partial effacement of right lateral ventricle and 5 mm right-to-left midline shift. MRA head: 1. Several M2 branches extending to the right anterolateral frontal region appear occluded, the area is partially obscured by artifact from motion and the presence of hemorrhage within the MCA cistern. 2. Right cavernous ICA medially directed aneurysm measuring 2 mm to dome with 4 mm base. 3. Otherwise no large vessel occlusion, aneurysm, or significant stenosis is identified. Electronically Signed   By: Mitzi Hansen M.D.   On: 10/25/2017 14:23   Mr Brain Wo Contrast  Result Date: 10/25/2017 CLINICAL DATA:  83 y/o  F; stroke follow-up. EXAM: MRI HEAD WITHOUT CONTRAST MRA HEAD WITHOUT CONTRAST TECHNIQUE: Multiplanar, multiecho pulse sequences of the brain and surrounding structures were obtained without intravenous contrast. Angiographic images of the head were obtained using MRA technique without contrast. COMPARISON:  10/25/2017 CT head. FINDINGS: MRI HEAD FINDINGS Brain: Multiple acute parenchymal hemorrhage with intermediate T1 and hypo/intermediate T2 signal. Large hematoma within the right anterior temporal lobe and the right frontal lobe are grossly stable from prior CT of head given differences in technique. Hemorrhage within the caudothalamic groove and the right putamen are stable to mildly increased in size. Surrounding T2 FLAIR signal abnormality is compatible with vasogenic edema and resultant mass effect partially effaces the right lateral ventricle and results in 5 mm right to left midline shift when measured in a similar fashion (series 7, image 18). Subarachnoid hemorrhage is present predominantly within the sylvian fissures and overlying the cerebellum and there is intraventricular hemorrhage pooling within occipital horns  and extending into the right lateral ventricle. Ventricle size is stable. No downward herniation. Area of reduced diffusion within the right lentiform nucleus extending into caudate body and corona radiata compatible with acute infarction. There are several additional small cortical infarctions within the right MCA distribution. Vascular: As below. Skull and upper cervical spine: Normal marrow signal. Sinuses/Orbits: No abnormal signal of mastoid air cells. Bilateral intra-ocular lens replacement. Extensive paranasal sinus disease the right ethmoid and maxillary sinus opacification. Other: None. MRA HEAD FINDINGS Internal carotid arteries: Right cavernous ICA medially directed aneurysm measuring 2 mm to dome with 4 mm base (series 4, image 103). Anterior cerebral arteries:  Patent. Middle cerebral arteries: Patent left M1 and distal circulation. Patent right M1 and proximal M2 branches. Several M2 branches extending to the right anterolateral frontal region appear occluded, the area is partially obscured by artifact from motion and the presence of hemorrhage within the MCA cistern. Anterior communicating artery: Patent. Posterior communicating arteries:  Probable diminutive bilaterally. Posterior cerebral arteries:  Patent. Basilar artery:  Patent. Vertebral arteries:  Patent. IMPRESSION: MRI head: 1. Stable large acute hemorrhage within right anterior temporal lobe and right frontal lobe. 2. Acute hemorrhage within right caudothalamic groove and right putamen are stable mildly increased in size given differences in technique. 3. Subarachnoid hemorrhage predominantly within sylvian fissures and right MCA cistern. 4. Intraventricular hemorrhage within occipital horns and right temporal horn of lateral ventricle. Stable ventricle size. 5. Acute infarct of right basal ganglia involving putamen and caudate body. Additional scattered acute cortical infarcts in right MCA distribution. 6. Stable mass effect with partial  effacement of right lateral ventricle and 5 mm right-to-left midline shift. MRA head: 1. Several M2 branches extending to the right anterolateral frontal region appear occluded, the area is partially obscured by artifact  from motion and the presence of hemorrhage within the MCA cistern. 2. Right cavernous ICA medially directed aneurysm measuring 2 mm to dome with 4 mm base. 3. Otherwise no large vessel occlusion, aneurysm, or significant stenosis is identified. Electronically Signed   By: Mitzi Hansen M.D.   On: 10/25/2017 14:23   Ir US Guide Vasc Access Right  Result Date: 11-20-17 INDICATION: 83 year old female with a history of acute right MCA syndrome, normal baseline, stroke exam of 12, CT imaging showing large vessel occlusion. She has been transferred after a dose of IV tPA, formed attempt at mechanical thrombectomy. CT performed to evaluate for ASPECTS decay shows stable aspects score, however, hemorrhagic transformation in several small peripheral regions and basal ganglia. After a thorough discussion of risks and benefits with the Stroke Neurology team, it is felt that attempting mechanical thrombectomy will give the patient the best chance of recovery. EXAM: ULTRASOUND GUIDED ACCESS RIGHT COMMON FEMORAL ARTERY CEREBRAL ANGIOGRAM MECHANICAL THROMBECTOMY RIGHT M2 BRANCH SUTURE MEDIATED CLOSURE OF RIGHT COMMON FEMORAL ARTERY ACCESS COMPARISON:  CT imaging same day MEDICATIONS: Vancomycin 1 gm IV. The antibiotic was administered within 1 hour of the procedure ANESTHESIA/SEDATION: General anesthesia CONTRAST:  148 cc Isovue FLUOROSCOPY TIME:  Fluoroscopy Time: 56 minutes 48 seconds (1,910 mGy). COMPLICATIONS: None immediate. TECHNIQUE: Emergent consent was obtained/presumed from as there is no family available. Specific risks include: Bleeding, infection, contrast reaction, kidney injury/failure, need for further procedure/surgery, arterial injury or dissection, embolization to new territory,  intracranial hemorrhage (10-15% risk), neurologic deterioration, cardiopulmonary collapse, death. All questions were addressed. Maximal Sterile Barrier Technique was utilized including during the procedure including caps, mask, sterile gowns, sterile gloves, sterile drape, hand hygiene and skin antiseptic. A timeout was performed prior to the initiation of the procedure. FINDINGS: Initial: Significant tortuosity at the aortic arch with type 3 arch Right common carotid artery: Mild tortuosity of the common carotid artery without significant plaque. Right external carotid artery: Patent with antegrade flow. Right internal carotid artery: Normal course caliber and contour of the cervical portion. Vertical and petrous segment patent with normal course caliber contour. Cavernous segment patent. Clinoid segment patent. Antegrade flow of the ophthalmic artery. Ophthalmic segment patent. Terminus patent. Right MCA:  M1 segment patent.  Occlusion of superior division. Right ACA: A 1 segment patent. A 2 segment perfuses the right territory. Cross filling of the communicating artery with some flash filling of the left anterior cerebral territory. Final: Final angiogram demonstrates restoration of TICI3 flow of the MCA territory. PROCEDURE: Patient is brought to the interventional radiology suite with the patient's identity confirmed. Patient positioned supine position on the fluoroscopy table. General anesthesia was induced by the anesthesia team. Patient is then prepped and draped in the usual sterile fashion. Ultrasound survey of the right inguinal region was performed with images stored and sent to PACs. 11 blade scalpel was used to make a small incision. A micropuncture needle was used access the right common femoral artery under ultrasound. With excellent arterial blood flow returned, an .018 micro wire was passed through the needle, observed to enter the abdominal aorta under fluoroscopy. The needle was removed, and a  micropuncture sheath was placed over the wire. The inner dilator and wire were removed, and an 035 Bentson wire was advanced under fluoroscopy into the abdominal aorta. The sheath was removed and a standard 5 [place] vascular sheath was placed. The dilator was removed and the sheath was flushed. A 69F JB-1 diagnostic catheter was advanced over the wire to the  proximal descending thoracic aorta. Wire was then removed. Double flush of the catheter was performed. Catheter was then used to select the innominate artery. Angiogram was performed. Using roadmap technique, the catheter was advanced over a roadrunner wire, immediately flipping into the aortic arch given the tortuosity. Roadrunner wire was removed and a Glidewire was used. Using the Glidewire, catheter was advanced into the proximal segment of the internal carotid artery. Attempt a passing the Rosen wire was initially unsuccessful, as the diagnostic catheter withdrew into the proximal internal carotid artery. Roadrunner wire was then passed into the distal ICA, and the diagnostic catheter was then repositioned into the distal ICA. Rosen wire was then placed into the distal ICA. Formal angiogram was performed. The 5 French sheath was removed and exchanged for 8 French 55 centimeter BrightTip sheath. Sheath was flushed and attached to pressurized and heparinized saline bag for constant forward flow. Then an 8 [place], 85 cm Flowgate balloon tip catheter was prepared on the back table with inflation of the balloon with 50/50 concentration of dilute contrast. The balloon catheter was then advanced over the wire, an using the coaxial Bern shaped catheter, positioned into the mid internal carotid artery. Copious back flush was performed and the balloon catheter was attached to heparinized and pressurized saline bag for forward flow. Angiogram was performed for road map guide. Microcatheter system was then introduced through the balloon guide catheter, using a synchro  soft 014 wire and a Trevo Provue18 catheter. Microcatheter system was advanced into the internal carotid artery, to the level of the occlusion. The micro wire was then carefully advanced through the occluded M2 segment. Microcatheter would not pass over the wire through the occluded segment without significant tension on the system, which caused the balloon guide catheter to prolapse into the aortic arch. A was necessary to removed microcatheter and microwire, and reduce the system by removal of the balloon guide catheter. JB 1 diagnostic catheter and Glidewire were then used to select the innominate artery. JB 1 catheter was advanced into the internal carotid artery over the standard Glidewire. Again, the roadrunner wire was then placed into the distal internal carotid artery an the JB 1 catheter was advanced distally to the internal carotid artery. Rosen wire was then placed to the distal internal carotid artery. Eight [place] Merci balloon guide catheter was then advanced over the Rosen wire into the internal carotid artery, using coaxial straight catheter/introducer. Once the Merci balloon guide catheter was in place, roadmap angiogram was performed. Microcatheter and synchro soft wire were then used to navigate to the face of the occlusion. Microcatheter was then push through the occluded segment and the wire was removed. Blood was then aspirated through the hub of the microcatheter, and a gentle contrast injection was performed confirming intraluminal position. A rotating hemostatic valve was then attached to the back end of the microcatheter, and a pressurized and heparinized saline bag was attached to the catheter. 4 x 40 solitaire device was then selected. Back flush was achieved at the rotating hemostatic valve, and then the device was gently advanced through the microcatheter to the distal end. The retriever was then unsheathed by withdrawing the microcatheter under fluoroscopy. Once the retriever was  completely unsheathed, control angiogram was performed from the balloon catheter. The balloon at the balloon tip catheter was then inflated under fluoroscopy for proximal flow arrest. Constant aspiration was then performed at the tip of the balloon guide catheter as the retriever was gently and slowly withdrawn with fluoroscopic observation.  Once the retriever was entirely removed from the system, free aspiration was confirmed at the hub of the balloon guide catheter, with free blood return confirmed. The balloon was then deflated, rotating hemostatic valve was reattached, and a control angiogram was performed. Segment remained occluded after the first pass. Again, the microcatheter and microwire were advanced to the face of the occlusion, and through the occlusion into the angular branch. Wire was removed. Blood was then aspirated through the hub of the microcatheter, and a gentle contrast injection was performed confirming intraluminal position. A rotating hemostatic valve was then attached to the back end of the microcatheter, and a pressurized and heparinized saline bag was attached to the catheter. 4 x 40 solitaire device was then selected. Back flush was achieved at the rotating hemostatic valve, and then the device was gently advanced through the microcatheter to the distal end. The retriever was then unsheathed by withdrawing the microcatheter under fluoroscopy. Once the retriever was completely unsheathed, control angiogram was performed from the balloon catheter. Segment remained occluded after the second pass. A cat 5 intermediate catheter was then loaded with the coaxial Trevo microcatheter. Combination the microcatheter and the intermediate catheter were then advanced to the proximal aspect of the clot. Wire was navigated into the angular branch with a microcatheter passed into the angular branch. Cat 5 catheter was advanced into the distal M1 segment. 4 x 40 solitaire device was then again selected and  deployed. Mercy balloon catheter was inflated. After deployment, the cat 5 catheter was advanced under aspiration over the solitaire into the origin of the occluded segment. The entire system was then removed. Angiogram was performed, confirming restoration of TICI 3 flow. Control angiogram was performed at the right common femoral artery puncture site. The 55 centimeter 8 French sheath was removed over the roadrunner wire. Overlapping suture devices were used for closure of the right common femoral artery access. Patient tolerated the procedure well and remained hemodynamically stable throughout. Estimated blood loss from a scalp laceration is approximately 200 cc. IMPRESSION: Status post cerebral angiogram and successful mechanical thrombectomy of occluded right superior division M2 branch, with restoration of TICI 3 flow. Closure of right common femoral artery access with overlapping suture mediated devices. Signed, Yvone Neu. Loreta Ave DO [facility] Radiology PLAN: Patient extubated in IR. Transport to CT for post CT Overnight ICU admission Right leg straight overnight for hemostasis. Frequent neurovascular checks. Electronically Signed   By: Gilmer Mor D.O.   On: 01-Nov-2017 16:41   Dg Chest Port 1 View  Result Date: 10/27/2017 CLINICAL DATA:  Respiratory failure with hypoxia EXAM: PORTABLE CHEST 1 VIEW COMPARISON:  October 26, 2017 FINDINGS: Endotracheal tube tip is now 1.8 cm above the carina. Central catheter tip is in the superior vena cava. Nasogastric tube tip is below the diaphragm with the side port in the proximal stomach region. No pneumothorax. There is persistent consolidation throughout the left lung with volume loss. There may be ease a degree of effusions superimposed on the left. There is shift of heart and mediastinum toward the left. Right lung is hyperexpanded but clear. The heart size is within normal limits. Pulmonary vascularity on the  right appears unremarkable. Pulmonary vascular on the left is obscured. No bone lesions. No adenopathy in regions that can be interrogated. IMPRESSION: Tube and catheter positions as described without pneumothorax. Consolidation with volume loss on the left with shift of heart and mediastinum toward the left. This finding raises concern for potential  mucous plugging on the left causing this degree of volume loss. Right lung is hyperexpanded clear. Cardiac silhouette grossly within normal limits and stable. These results will be called to the ordering clinician or representative by the Radiologist Assistant, and communication documented in the PACS or zVision Dashboard. Electronically Signed   By: Bretta Bang III M.D.   On: 10/27/2017 08:00   Dg Chest Port 1 View  Result Date: 10/26/2017 CLINICAL DATA:  83 y/o F; post intubation and enteric tube placement. EXAM: PORTABLE CHEST 1 VIEW COMPARISON:  10/26/2017 chest radiograph. FINDINGS: Endotracheal tube tip is in the right mainstem bronchus. Increased opacification of the left lung likely representing worsening atelectasis. Stable large left effusion. Interval leftward mediastinal shift likely due to left lung atelectasis. Clear right lung. No acute osseous abnormality is evident. Enteric tube tip extends below the field of view in the abdomen. Left central venous catheter tip projects over the lower SVC. IMPRESSION: 1. Endotracheal tube tip in the origin of right mainstem bronchus, retraction recommended. 2. Increasing opacification of left hemithorax likely representing worsening atelectasis of the lung. Stable large left effusion. These results will be called to the ordering clinician or representative by the Radiologist Assistant, and communication documented in the PACS or zVision Dashboard. Electronically Signed   By: Mitzi Hansen M.D.   On: 10/26/2017 22:32   Dg Chest Port 1 View  Result Date: 10/26/2017 CLINICAL DATA:  Hypoxia EXAM:  PORTABLE CHEST 1 VIEW COMPARISON:  Chest radiograph 10/25/2017 FINDINGS: There is complete collapse of the left lower lobe with leftward mediastinal shift. Suspected left pleural effusion. Decreased aeration of the left upper lobe. Left subclavian central venous catheter tip is at the cavoatrial junction. The right lung is clear. IMPRESSION: Complete collapse of the left lower lobe with associated left hemithoracic volume loss. This may be secondary to mucous plugging. Electronically Signed   By: Deatra Robinson M.D.   On: 10/26/2017 21:57   Dg Chest Port 1 View  Result Date: 10/25/2017 CLINICAL DATA:  Status post central line placement. Acute CVA. History of asthma and hypertension. EXAM: PORTABLE CHEST 1 VIEW COMPARISON:  Chest x-ray of October 27, 2017 FINDINGS: The lungs are well-expanded. There is no focal infiltrate. There is no pleural effusion or pneumothorax. The left subclavian venous catheter tip projects over the midportion of the SVC. The heart is mildly enlarged. The pulmonary vascularity is normal. There is no IMPRESSION: No postprocedure complication following left internal jugular venous catheter placement. Mild cardiomegaly without pulmonary edema. Thoracic aortic atherosclerosis. Electronically Signed   By: David  Swaz[place]and M.D.   On: 10/25/2017 10:09   Dg Chest Port 1 View  Result Date: 10/25/2017 CLINICAL DATA:  Abnormal chest x-ray EXAM: PORTABLE CHEST 1 VIEW COMPARISON:  06/08/2017 FINDINGS: Mild to moderate cardiomegaly. Aortic atherosclerosis. No consolidation or effusion. No pneumothorax. IMPRESSION: Cardiomegaly without edema or infiltrate. Electronically Signed   By: Jasmine Pang M.D.   On: 10/25/2017 03:45   Ir Percutaneous Art Thrombectomy/infusion Intracranial Inc Diag Angio  Result Date: 10/27/17 INDICATION: 83 year old female with a history of acute right MCA syndrome, normal baseline, stroke exam of 12, CT imaging showing large vessel occlusion. She has been transferred  after a dose of IV tPA, formed attempt at mechanical thrombectomy. CT performed to evaluate for ASPECTS decay shows stable aspects score, however, hemorrhagic transformation in several small peripheral regions and basal ganglia. After a thorough discussion of risks and benefits with the Stroke Neurology team, it is felt that attempting mechanical thrombectomy  will give the patient the best chance of recovery. EXAM: ULTRASOUND GUIDED ACCESS RIGHT COMMON FEMORAL ARTERY CEREBRAL ANGIOGRAM MECHANICAL THROMBECTOMY RIGHT M2 BRANCH SUTURE MEDIATED CLOSURE OF RIGHT COMMON FEMORAL ARTERY ACCESS COMPARISON:  CT imaging same day MEDICATIONS: Vancomycin 1 gm IV. The antibiotic was administered within 1 hour of the procedure ANESTHESIA/SEDATION: General anesthesia CONTRAST:  148 cc Isovue FLUOROSCOPY TIME:  Fluoroscopy Time: 56 minutes 48 seconds (1,910 mGy). COMPLICATIONS: None immediate. TECHNIQUE: Emergent consent was obtained/presumed from as there is no family available. Specific risks include: Bleeding, infection, contrast reaction, kidney injury/failure, need for further procedure/surgery, arterial injury or dissection, embolization to new territory, intracranial hemorrhage (10-15% risk), neurologic deterioration, cardiopulmonary collapse, death. All questions were addressed. Maximal Sterile Barrier Technique was utilized including during the procedure including caps, mask, sterile gowns, sterile gloves, sterile drape, hand hygiene and skin antiseptic. A timeout was performed prior to the initiation of the procedure. FINDINGS: Initial: Significant tortuosity at the aortic arch with type 3 arch Right common carotid artery: Mild tortuosity of the common carotid artery without significant plaque. Right external carotid artery: Patent with antegrade flow. Right internal carotid artery: Normal course caliber and contour of the cervical portion. Vertical and petrous segment patent with normal course caliber contour. Cavernous  segment patent. Clinoid segment patent. Antegrade flow of the ophthalmic artery. Ophthalmic segment patent. Terminus patent. Right MCA:  M1 segment patent.  Occlusion of superior division. Right ACA: A 1 segment patent. A 2 segment perfuses the right territory. Cross filling of the communicating artery with some flash filling of the left anterior cerebral territory. Final: Final angiogram demonstrates restoration of TICI3 flow of the MCA territory. PROCEDURE: Patient is brought to the interventional radiology suite with the patient's identity confirmed. Patient positioned supine position on the fluoroscopy table. General anesthesia was induced by the anesthesia team. Patient is then prepped and draped in the usual sterile fashion. Ultrasound survey of the right inguinal region was performed with images stored and sent to PACs. 11 blade scalpel was used to make a small incision. A micropuncture needle was used access the right common femoral artery under ultrasound. With excellent arterial blood flow returned, an .018 micro wire was passed through the needle, observed to enter the abdominal aorta under fluoroscopy. The needle was removed, and a micropuncture sheath was placed over the wire. The inner dilator and wire were removed, and an 035 Bentson wire was advanced under fluoroscopy into the abdominal aorta. The sheath was removed and a standard 5 [place] vascular sheath was placed. The dilator was removed and the sheath was flushed. A 48F JB-1 diagnostic catheter was advanced over the wire to the proximal descending thoracic aorta. Wire was then removed. Double flush of the catheter was performed. Catheter was then used to select the innominate artery. Angiogram was performed. Using roadmap technique, the catheter was advanced over a roadrunner wire, immediately flipping into the aortic arch given the tortuosity. Roadrunner wire was removed and a Glidewire was used. Using the Glidewire, catheter was advanced into  the proximal segment of the internal carotid artery. Attempt a passing the Rosen wire was initially unsuccessful, as the diagnostic catheter withdrew into the proximal internal carotid artery. Roadrunner wire was then passed into the distal ICA, and the diagnostic catheter was then repositioned into the distal ICA. Rosen wire was then placed into the distal ICA. Formal angiogram was performed. The 5 French sheath was removed and exchanged for 8 French 55 centimeter BrightTip sheath. Sheath was flushed and attached to  pressurized and heparinized saline bag for constant forward flow. Then an 8 [place], 85 cm Flowgate balloon tip catheter was prepared on the back table with inflation of the balloon with 50/50 concentration of dilute contrast. The balloon catheter was then advanced over the wire, an using the coaxial Bern shaped catheter, positioned into the mid internal carotid artery. Copious back flush was performed and the balloon catheter was attached to heparinized and pressurized saline bag for forward flow. Angiogram was performed for road map guide. Microcatheter system was then introduced through the balloon guide catheter, using a synchro soft 014 wire and a Trevo Provue18 catheter. Microcatheter system was advanced into the internal carotid artery, to the level of the occlusion. The micro wire was then carefully advanced through the occluded M2 segment. Microcatheter would not pass over the wire through the occluded segment without significant tension on the system, which caused the balloon guide catheter to prolapse into the aortic arch. A was necessary to removed microcatheter and microwire, and reduce the system by removal of the balloon guide catheter. JB 1 diagnostic catheter and Glidewire were then used to select the innominate artery. JB 1 catheter was advanced into the internal carotid artery over the standard Glidewire. Again, the roadrunner wire was then placed into the distal internal carotid  artery an the JB 1 catheter was advanced distally to the internal carotid artery. Rosen wire was then placed to the distal internal carotid artery. Eight [place] Merci balloon guide catheter was then advanced over the Rosen wire into the internal carotid artery, using coaxial straight catheter/introducer. Once the Merci balloon guide catheter was in place, roadmap angiogram was performed. Microcatheter and synchro soft wire were then used to navigate to the face of the occlusion. Microcatheter was then push through the occluded segment and the wire was removed. Blood was then aspirated through the hub of the microcatheter, and a gentle contrast injection was performed confirming intraluminal position. A rotating hemostatic valve was then attached to the back end of the microcatheter, and a pressurized and heparinized saline bag was attached to the catheter. 4 x 40 solitaire device was then selected. Back flush was achieved at the rotating hemostatic valve, and then the device was gently advanced through the microcatheter to the distal end. The retriever was then unsheathed by withdrawing the microcatheter under fluoroscopy. Once the retriever was completely unsheathed, control angiogram was performed from the balloon catheter. The balloon at the balloon tip catheter was then inflated under fluoroscopy for proximal flow arrest. Constant aspiration was then performed at the tip of the balloon guide catheter as the retriever was gently and slowly withdrawn with fluoroscopic observation. Once the retriever was entirely removed from the system, free aspiration was confirmed at the hub of the balloon guide catheter, with free blood return confirmed. The balloon was then deflated, rotating hemostatic valve was reattached, and a control angiogram was performed. Segment remained occluded after the first pass. Again, the microcatheter and microwire were advanced to the face of the occlusion, and through the occlusion into the  angular branch. Wire was removed. Blood was then aspirated through the hub of the microcatheter, and a gentle contrast injection was performed confirming intraluminal position. A rotating hemostatic valve was then attached to the back end of the microcatheter, and a pressurized and heparinized saline bag was attached to the catheter. 4 x 40 solitaire device was then selected. Back flush was achieved at the rotating hemostatic valve, and then the device was gently advanced through the  microcatheter to the distal end. The retriever was then unsheathed by withdrawing the microcatheter under fluoroscopy. Once the retriever was completely unsheathed, control angiogram was performed from the balloon catheter. Segment remained occluded after the second pass. A cat 5 intermediate catheter was then loaded with the coaxial Trevo microcatheter. Combination the microcatheter and the intermediate catheter were then advanced to the proximal aspect of the clot. Wire was navigated into the angular branch with a microcatheter passed into the angular branch. Cat 5 catheter was advanced into the distal M1 segment. 4 x 40 solitaire device was then again selected and deployed. Mercy balloon catheter was inflated. After deployment, the cat 5 catheter was advanced under aspiration over the solitaire into the origin of the occluded segment. The entire system was then removed. Angiogram was performed, confirming restoration of TICI 3 flow. Control angiogram was performed at the right common femoral artery puncture site. The 55 centimeter 8 French sheath was removed over the roadrunner wire. Overlapping suture devices were used for closure of the right common femoral artery access. Patient tolerated the procedure well and remained hemodynamically stable throughout. Estimated blood loss from a scalp laceration is approximately 200 cc. IMPRESSION: Status post cerebral angiogram and successful mechanical thrombectomy of occluded right superior  division M2 branch, with restoration of TICI 3 flow. Closure of right common femoral artery access with overlapping suture mediated devices. Signed, Yvone NeuJaime S. Loreta AveWagner, DO [facility]: Patient extubated in IR. Transport to CT for post CT Overnight ICU admission Right leg straight overnight for hemostasis. Frequent neurovascular checks. Electronically Signed   By: Gilmer MorJaime  Wagner D.O.   On: 04/29/17 16:41   Dg Hip Unilat With Pelvis 2-3 Views Left  Result Date: 10/26/2017 CLINICAL DATA:  Left hip fracture. EXAM: DG HIP (WITH OR WITHOUT PELVIS) 2-3V LEFT COMPARISON:  10/22/2017 FINDINGS: Transverse fracture of the left femoral neck with varus angulation. Superior and lateral displacement of the distal fracture fragment with respect to the proximal fragment. No dislocation at the hip joint. Similar appearance to previous study. Pelvis appears intact. SI joints and symphysis pubis are not displaced. Visualized sacrum appears intact. IMPRESSION: Transverse subcapital fracture of the left femoral neck with varus angulation. No change in position since previous study. Electronically Signed   By: Burman NievesWilliam  Stevens M.D.   On: 10/26/2017 00:11    Labs:  CBC: Recent Labs    10/25/17 1525 10/25/17 2158 10/26/17 0133 10/27/17 0145  WBC 6.0 5.7 5.0 4.3  HGB 7.7* 7.6* 7.4* 7.6*  HCT 22.4* 22.0* 21.7* 22.7*  PLT 95* 96* 102* 135*    COAGS: Recent Labs    03/08/2017 2102 10/26/17 0133  INR 1.38 1.25    BMP: Recent Labs    03/08/2017 2102  10/25/17 0510  10/26/17 0133  10/26/17 1254 10/26/17 1944 10/27/17 0145 10/27/17 0649  NA 132*   < > 136   < > 146*   < > 151* 155* 156* 155*  K 3.6  --  4.1  --  3.3*  --   --   --  3.0*  --   CL 106  --  108  --  123*  --   --   --  >130*  --   CO2 19*  --  18*  --  20*  --   --   --  20*  --   GLUCOSE 144*  --  124*  --  129*  --   --   --  153*  --   BUN 13  --  13  --  15  --   --   --  11  --    CALCIUM 7.6*  --  7.8*  --  8.0*  --   --   --  8.4*  --   CREATININE 0.71  --  0.71  --  0.69  --   --   --  0.64  --   GFRNONAA >60  --  >60  --  >60  --   --   --  >60  --   GFRAA >60  --  >60  --  >60  --   --   --  >60  --    < > = values in this interval not displayed.    LIVER FUNCTION TESTS: No results for input(s): BILITOT, AST, ALT, ALKPHOS, PROT, ALBUMIN in the last 8760 hours.  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  Per RN, neuro exam unchanged. However, currently sedated.  Groin intact.  IR available if needed.  Electronically Signed: Hoyt KochKacie Sue-Ellen Ninoska Goswick 10/27/2017, 12:17 PM   I spent a total of 15 Minutes at the the patient's bedside AND on the patient's hospital floor or unit, greater than 50% of which was counseling/coordinating care for CVA

## 2017-10-27 NOTE — Clinical Social Work Note (Signed)
Clinical Social Worker received notification from RN that patient friend, Carol Parker is heavily involved in patient life and may be willing to make decisions on patient behalf.  Per [place], since patient does not have any living immediate family, "an individual with an established relationship with the patient who is acting in good faith on behalf of the patient and who can reliably convey the patient's wishes" is eligible to withdrawal or withhold life-prolonging measures.    CSW spoke with patient friend, Carol Parker, who at the time was agreeable to Palliative Care involvement to establish goals of care and guide any further care provided to patient.  CSW updated MD, who provided RN with verbal order for Palliative Care.  CSW contacted Palliative Care NP to provide patient background and contact information for Carol Parker to further pursue.  CSW available as needed.  [name], Kentucky[place]CSW [phone]

## 2017-10-27 NOTE — Progress Notes (Signed)
Dr Xu updating fRoda Shuttersriend Worthy KeelerMary Stanley over phone. Pt now unresponsive tachycardic hypotensive.

## 2017-10-27 NOTE — Progress Notes (Signed)
Family friend Worthy KeelerMary Stanley called. In light of Ms. Loja's worsening, they wish for comfort to be Ms. Locher's priority. I will notify MDs.

## 2017-10-27 NOTE — Progress Notes (Signed)
Na level 156. Holding hypertonic saline. Will restart or continue to hold hypertonic saline following next Na draw at 7 AM.  Electronically signed: Dr. Caryl PinaEric Domingue Coltrain

## 2017-10-27 NOTE — Progress Notes (Signed)
OT Cancellation Note  Patient Details Name: Carol BrasilSusie M Parker MRN: [id number] DOB: [birth year]   Cancelled Treatment:    Reason Eval/Treat Not Completed: Patient not medically ready  Beckett SpringsWARD,HILLARY  Keayra Graham, OT/L  [phone] 10/27/2017 10/27/2017, 7:35 AM

## 2017-10-27 NOTE — Progress Notes (Signed)
Patient ID: Carol BrasilSusie M Parker, female   DOB: 1929/03/17, 83 y.o.   MRN: [id number]   LOS: 3 days   Subjective: After a day's worth of respiratory insufficiency pt was intubated last night. Prior to intubation she was evaluated by psychiatry who found her incompetent. She continued to c/o right hip pain, especially with turning.    Objective: Vital signs in last 24 hours: Temp:  [97.6 F (36.4 C)-98.7 F (37.1 C)] 97.7 F (36.5 C) (11/07 0400) Pulse Rate:  [72-113] 92 (11/07 0730) Resp:  [12-33] 17 (11/07 0730) BP: (108-156)/(51-82) 132/64 (11/07 0730) SpO2:  [86 %-100 %] 98 % (11/07 0730) Arterial Line BP: (143-187)/(55-82) 174/78 (11/07 0700) FiO2 (%):  [30 %-100 %] 30 % (11/07 0327) Last BM Date: (pta)   Laboratory  CBC Recent Labs    10/26/17 0133 10/27/17 0145  WBC 5.0 4.3  HGB 7.4* 7.6*  HCT 21.7* 22.7*  PLT 102* 135*   BMET Recent Labs    10/26/17 0133  10/27/17 0145 10/27/17 0649  NA 146*   < > 156* 155*  K 3.3*  --  3.0*  --   CL 123*  --  >130*  --   CO2 20*  --  20*  --   GLUCOSE 129*  --  153*  --   BUN 15  --  11  --   CREATININE 0.69  --  0.64  --   CALCIUM 8.0*  --  8.4*  --    < > = values in this interval not displayed.     Physical Exam General appearance: no distress  LLE: 2+DP, warm   Assessment/Plan: Left hip fx -- Intubation complicates things yet again as does incompetency. Orthopedic still ready to provide fixation of her hip fx which will likely be palliative. Unsure if stroke team will need to pursue guardian ad litem at this point for consents. Ortho will continue to follow, await guidance from primary team.     Freeman CaldronMichael J. Myrle Wanek, PA-C Orthopedic Surgery [phone] 10/27/2017

## 2017-10-27 NOTE — Progress Notes (Signed)
SLP Cancellation Note  Patient Details Name: Carol Parker MRN: [id number] DOB: Nov 27, 1929   Cancelled treatment:       Reason Eval/Treat Not Completed: Medical issues which prohibited therapy(Intubated. Signing off. Please re-consult when appropriate. )  Ferdinand LangoLeah Verlin Duke MA, CCC-SLP [phone]  Carol Parker 10/27/2017, 8:09 AM

## 2017-10-27 NOTE — Progress Notes (Signed)
This is an 83 year old who has suffered from a large MCA infarct and concurrent left hip fracture which is un fixed. She has deteriorated and required intubation in the last 12 hours, and is now in a-fib with a rapid ventricular response. We have discussed with her friends that the best possible outcome here with extremely aggressive support would be persistent hemiplegia and total dependence for ADL's. They do not feel she would wish this. As such I will only insure comfort and extubate when they arrive.

## 2017-10-27 NOTE — Progress Notes (Signed)
125mL fentanyl wasted into sink with Londell MohKaty Walton RN. Pt transitioned to morphine gtt.

## 2017-10-28 DIAGNOSIS — R4189 Other symptoms and signs involving cognitive functions and awareness: Secondary | ICD-10-CM

## 2017-10-28 DIAGNOSIS — I639 Cerebral infarction, unspecified: Secondary | ICD-10-CM

## 2017-10-28 DIAGNOSIS — I63311 Cerebral infarction due to thrombosis of right middle cerebral artery: Secondary | ICD-10-CM

## 2017-10-28 DIAGNOSIS — Z515 Encounter for palliative care: Secondary | ICD-10-CM

## 2017-10-28 MED ORDER — BIOTENE DRY MOUTH MT LIQD: 15.0000 mL | OROMUCOSAL | Status: DC | PRN

## 2017-10-28 MED ORDER — ONDANSETRON HCL 4 MG/2ML IJ SOLN: 4.0000 mg | Freq: Four times a day (QID) | INTRAMUSCULAR | Status: DC | PRN

## 2017-10-28 MED ORDER — HALOPERIDOL 0.5 MG PO TABS
0.5000 mg | ORAL_TABLET | ORAL | Status: DC | PRN
  Filled 2017-10-28: qty 1

## 2017-10-28 MED ORDER — GLYCOPYRROLATE 0.2 MG/ML IJ SOLN: 0.2000 mg | INTRAMUSCULAR | Status: DC | PRN

## 2017-10-28 MED ORDER — HALOPERIDOL LACTATE 2 MG/ML PO CONC
0.5000 mg | ORAL | Status: DC | PRN
  Filled 2017-10-28: qty 0.3

## 2017-10-28 MED ORDER — ACETAMINOPHEN 650 MG RE SUPP
650.0000 mg | Freq: Four times a day (QID) | RECTAL | Status: DC | PRN
Start: 2017-10-28 — End: 2017-10-28

## 2017-10-28 MED ORDER — ACETAMINOPHEN 325 MG PO TABS: 650.0000 mg | ORAL_TABLET | Freq: Four times a day (QID) | ORAL | Status: DC | PRN

## 2017-10-28 MED ORDER — MORPHINE BOLUS VIA INFUSION
2.0000 mg | INTRAVENOUS | Status: DC | PRN
  Filled 2017-10-28: qty 2

## 2017-10-28 MED ORDER — LORAZEPAM 1 MG PO TABS: 1.0000 mg | ORAL_TABLET | ORAL | Status: DC | PRN

## 2017-10-28 MED ORDER — GLYCOPYRROLATE 1 MG PO TABS
1.0000 mg | ORAL_TABLET | ORAL | Status: DC | PRN
  Filled 2017-10-28: qty 1

## 2017-10-28 MED ORDER — ONDANSETRON 4 MG PO TBDP: 4.0000 mg | ORAL_TABLET | Freq: Four times a day (QID) | ORAL | Status: DC | PRN

## 2017-10-28 MED ORDER — HALOPERIDOL LACTATE 5 MG/ML IJ SOLN: 0.5000 mg | INTRAMUSCULAR | Status: DC | PRN

## 2017-10-28 MED ORDER — LORAZEPAM 2 MG/ML IJ SOLN: 1.0000 mg | INTRAMUSCULAR | Status: DC | PRN

## 2017-10-28 MED ORDER — POLYVINYL ALCOHOL 1.4 % OP SOLN
1.0000 [drp] | Freq: Four times a day (QID) | OPHTHALMIC | Status: DC | PRN
  Filled 2017-10-28: qty 15

## 2017-10-28 MED ORDER — LORAZEPAM 2 MG/ML PO CONC: 1.0000 mg | ORAL | Status: DC | PRN

## 2017-10-29 SURGERY — HEMIARTHROPLASTY, HIP, DIRECT ANTERIOR APPROACH, FOR FRACTURE
Anesthesia: General | Laterality: Left

## 2017-10-30 LAB — CULTURE, RESPIRATORY W GRAM STAIN: Special Requests: NORMAL

## 2017-10-30 LAB — CULTURE, RESPIRATORY

## 2017-11-10 ENCOUNTER — Ambulatory Visit: Payer: Medicare Other | Admitting: Sports Medicine

## 2017-11-20 NOTE — Progress Notes (Signed)
PT Cancellation Note  Patient Details Name: Carol BrasilSusie M Tramell MRN: [id number] DOB: [birth year]   Cancelled Treatment:    Reason Eval/Treat Not Completed: Medical issues which prohibited therapy(pt extubated, full comfort with hip fx. Will sign off. Please reorder should pt status change)   Chane Cowden B Kameryn Tisdel 10/23/2017, 7:14 AM  Delaney MeigsMaija Tabor Fain Francis, PT [phone]

## 2017-11-20 NOTE — Progress Notes (Signed)
Responded to page to support deceased pt's family . Family Renato Gailsastor is providing support. Supported Haematologiststaff.

## 2017-11-20 NOTE — Plan of Care (Signed)
Morphine gtt, reposition q2hrs for comfort, foley for end-of-life care.

## 2017-11-20 NOTE — Progress Notes (Signed)
OT Cancellation Note  Patient Details Name: Carol Parker MRN: [id number] DOB: 1929-10-25   Cancelled Treatment:    Reason Eval/Treat Not Completed: Other (comment). Pt is comfort care. Will sign off at this time.   [facility] LLC[facility],HILLARY  Maryjayne Kleven, OT/L  [phone] 11/13/2017 10/25/2017, 8:12 AM

## 2017-11-20 NOTE — Care Management Note (Signed)
Case Management Note  Patient Details  Name: Carol BrasilSusie M Parker MRN: [id number] Date of Birth: 10-13-1929  Subjective/Objective:   Pt admitted on 11/09/2017 with RT basal ganglia infarct with RT M1 occlusion s/p TPA and IR mechanical thrombectomy.  PTA, pt from home alone; she has no available family, only friends to assist with decision making.                  Action/Plan: Palliative care team involved to assist with goals of care.    Expected Discharge Date:  01/18/2017               Expected Discharge Plan:  Hospice Medical Facility  In-House Referral:  Clinical Social Work  Discharge planning Services  CM Consult  Post Acute Care Choice:    Choice offered to:     DME Arranged:    DME Agency:     HH Arranged:    HH Agency:     Status of Service:  Completed, signed off  If discussed at [employer] Meetings, dates discussed:    Additional Comments:  10/26/2017 J. Konni Kesinger, Charity fundraiserN, BSN Pt expired [facility] [place].  CSW aware.    Glennon MacAmerson, Ivylynn Hoppes M, RN 11/08/2017, 3:14 PM

## 2017-11-20 NOTE — Progress Notes (Signed)
PULMONARY / CRITICAL CARE MEDICINE   Name: Carol Parker MRN: [id number] DOB: 1929-11-02    ADMISSION DATE:  11/14/2017   CHIEF COMPLAINT:  Left hemiparesis  HISTORY OF PRESENT ILLNESS:   This is an 83 year old who normally takes Xeralto for chronic atrial fibrillation.  She fell on 11/2 and fractured her left hip.  Xarelto was held anticipating surgery on her hip but on 11/4 she developed a left hemiparesis.  She was given TPA and transferred to this hospital where a thrombectomy for a right MCA occlusion was performed.  Following the thrombectomy her hemoglobin had dropped substantially and she received 2 units of packed red blood cells and the TPA was reversed with cryoprecipitate.  Follow-up CT scan on 11/5 has shown some mass-effect from her stroke with a 4.6 mm midline shift.  3% saline is in place. This morning she is more alert for me. She complains only of left hip pain.   PAST MEDICAL HISTORY :  DM, atrial fibrillation  SUBJECTIVE:  Extubated yesterday for comfort measures after discussions with lifelong friends, Renato Gailsastor (which are her only support persons).  No acute issues overnight  VITAL SIGNS: BP (!) 75/64   Pulse (!) 102   Temp 98.1 F (36.7 C) (Axillary)   Resp 19   Ht 5\' 7"  (1.702 m)   Wt 70 kg (154 lb 5.2 oz)   SpO2 93%   BMI 24.17 kg/m     INTAKE / OUTPUT: I/O last 3 completed shifts: In: 1035.7 [I.V.:435.7; IV Piggyback:600] Out: 1100 [Urine:1100]  PHYSICAL EXAMINATION: General: Chronically ill appearing elderly female in NAD Neuro: Unresponsive on low-dose morphine drip, appears comfortable Cardiovascular:  s1s2 rrr, no JVD  Lungs: Respirations agonal intermittently, diminished throughout  Abdomen: Soft hypoactive bowel sounds Musculoskeletal: She has some slight external rotation of the left foot.  No significant ecchymoses over the left hip.   LABS:  BMET Recent Labs  Lab 10/25/17 0510  10/26/17 0133  10/27/17 0145 10/27/17 0649  10/27/17 1250  NA 136   < > 146*   < > 156* 155* 156*  K 4.1  --  3.3*  --  3.0*  --   --   CL 108  --  123*  --  >130*  --   --   CO2 18*  --  20*  --  20*  --   --   BUN 13  --  15  --  11  --   --   CREATININE 0.71  --  0.69  --  0.64  --   --   GLUCOSE 124*  --  129*  --  153*  --   --    < > = values in this interval not displayed.    Electrolytes Recent Labs  Lab 10/25/17 0510 10/26/17 0133 10/27/17 0145 10/27/17 1249  CALCIUM 7.8* 8.0* 8.4*  --   MG 2.5* 2.0 1.8 1.6*  PHOS 2.7 1.6* 1.7* 1.8*    CBC Recent Labs  Lab 10/25/17 2158 10/26/17 0133 10/27/17 0145  WBC 5.7 5.0 4.3  HGB 7.6* 7.4* 7.6*  HCT 22.0* 21.7* 22.7*  PLT 96* 102* 135*    Coag's Recent Labs  Lab 10/26/2017 2102 10/26/17 0133  INR 1.38 1.25    Sepsis Markers Recent Labs  Lab 10/26/17 2221 10/27/17 0145  LATICACIDVEN 1.0 0.9  PROCALCITON <0.10 <0.10    ABG Recent Labs  Lab 11/03/2017 2117 10/26/17 2140 10/26/17 2315  PHART 7.366 7.399 7.374  PCO2ART 34.9 32.1 33.3  PO2ART 185.0* 89.8 160*    Liver Enzymes No results for input(s): AST, ALT, ALKPHOS, BILITOT, ALBUMIN in the last 168 hours.  Cardiac Enzymes No results for input(s): TROPONINI, PROBNP in the last 168 hours.  Glucose Recent Labs  Lab 10/26/17 1106 10/26/17 1737 10/26/17 2125 10/27/17 0805 10/27/17 1200 10/27/17 1251  GLUCAP 136* 108* 136* 88 67 119*    Imaging Dg Chest Port 1 View  Result Date: 10/27/2017 CLINICAL DATA:  Recent atelectasis of the left lung. Follow-up study. EXAM: PORTABLE CHEST 1 VIEW COMPARISON:  Chest x-ray of earlier today he at 5:42 a.m. FINDINGS: There has been re-expansion of much of the left lung. There remains left lower lobe atelectasis. The interstitial markings within the aerated portion of the left lung are mildly increased. There is no mediastinal shift. The right lung is hyperinflated and clear. The heart is mildly enlarged. The pulmonary vascularity is normal. There is  calcification in the wall of the aortic arch. The endotracheal tube tip projects approximately 4.3 cm above the carina. The esophagogastric tube spur proximal port projects at the level of the GE junction with the tip inferiorly. The left subclavian venous catheter tip projects over the midportion of the SVC. IMPRESSION: Marked improvement in the appearance of the left lung with partial re-expansion. There is persistent increased interstitial density which may reflect re-expansion edema. Persistent left lower lobe atelectasis or pneumonia. The support tubes are in reasonable position. Thoracic aortic atherosclerosis. Electronically Signed   By: David  SwazilandJordan M.D.   On: 10/27/2017 12:39     STUDIES:  MRI brain 11/5>>> 1. Stable large acute hemorrhage within right anterior temporal lobe and right frontal lobe. 2. Acute hemorrhage within right caudothalamic groove and right putamen are stable mildly increased in size given differences in technique. 3. Subarachnoid hemorrhage predominantly within sylvian fissures and right MCA cistern. 4. Intraventricular hemorrhage within occipital horns and right temporal horn of lateral ventricle. Stable ventricle size. 5. Acute infarct of right basal ganglia involving putamen and caudate body. Additional scattered acute cortical infarcts in right MCA distribution. 6. Stable mass effect with partial effacement of right lateral ventricle and 5 mm right-to-left midline shift.  CULTURES: Sputum 11/6>>>  ANTIBIOTICS: Levquin 11/6>>> Aztreonam 11/6>>>  SIGNIFICANT EVENTS:   LINES/TUBES: ETT 11/6>>>  DISCUSSION: 83 year old type II diabetic with chronic atrial fibrillation who suffered from a fractured hip on 11/ 2.  She developed left hemiparesis on 11/4, received TPA and subsequently had a MCA thrombectomy. She has been receiving 3% saline and mental status had been steadily improving.  However overnight on 11 6 she developed worsening respiratory  failure likely related to aspiration pneumonia and required intubation.  ASSESSMENT / PLAN:  PULMONARY  Acute respiratory failure secondary to aspiration pneumonia Mucous plugging/left-sided collapse-improved with Mucomyst and pulmonary hygiene PLAN- Extubated 11/7 for comfort As needed morphine No further labs or x-rays bronchodilators as needed   CARDIOVASCULAR AFib  HTN  PLAN -  Comfort measures only  GASTROINTESTINAL PLAN- N.p.o. Comfort measures only  HEMATOLOGIC No active issues.  She does have a large potential space to bleed into her hip especially given TPA administration. PLAN -  No further labs   NEUROLOGIC  significant right MCA territory infarct with midline shift PLAN -  No further labs or imaging Full comfort care Palliative care following   Can move to floor from PCCM standpoint for further comfort care.  Anticipate hospital death in the next 24-48 hours   PCCM will  sign off please call back if needed.    Dirk Dress, NP 10/29/2017  9:57 AM Pager: [phone] or [phone]

## 2017-11-20 NOTE — Consult Note (Signed)
Consultation Note Date: 11/05/17   Patient Name: Carol Parker  DOB: Feb 21, 1929  MRN: [id number]  Age / Sex: 83 y.o., female  PCP: Darlis Loan, NP Referring Physician: Rosalin Hawking, MD  Reason for Consultation: Establishing goals of care and Terminal Care  HPI/Patient Profile: 83 y.o. female  with past medical history of hypertension, asthma, diabetes, A. fib on chronic anticoagulation, recent UTI, recent left femoral head fracture admitted on 11/19/2017 with right ICA terminus and right MCA M1 segment occlusion.  She was subsequently intubated and continued to clinically decline.  She has no family and her friends have helped act as her surrogate and are clear that her if she were to understand her clinical situation her desire would be to pursue full comfort care.  She was transitioned to comfort care and extubated last evening.  I did see her briefly last evening and she appeared comfortable at that time.  Clinical Assessment and Goals of Care: I saw and examined Carol Parker again today.  I did see her last night and also met with her friends, including pastor of her church and another lifelong friend.  She is unresponsive today and appears to be actively dying.  No one at bedside today.  Reviewed chart and discussed with bedside RN.  SUMMARY OF RECOMMENDATIONS   - Patient is full comfort care. -She is actively dying. -She appears comfortable on examination.  I did add on additional medications for comfort (for anxiety, excess secretions) in case any other issues arise -At this point I anticipate her prognosis to be hours to days at best.  I believe this will be a hospital death.  It appears referral was placed for [facility] but no beds available.  Continue to assess her daily for appropriateness of transfer to residential hospice if her condition stabilizes enough to consider this.  Do not think this  will be likely.  Code Status/Advance Care Planning:  DNR   Symptom Management:   Pain/shortness of breath: Currently on morphine infusion.  I did add on additional bolus dosing of morphine as needed.  Would continue to titrate drip based upon her as needed usage if needed to maintain her comfort.  Anxiety: Currently not an issue.  I did add on as needed Ativan in case this becomes an issue.  Excess secretions: Robinul as needed  Palliative Prophylaxis:   Bowel Regimen and Delirium Protocol  Additional Recommendations (Limitations, Scope, Preferences):  Full Comfort Care  Psycho-social/Spiritual:   Desire for further Chaplaincy support: Not assess  Additional Recommendations: Caregiving  Support/Resources  Prognosis:   Hours - Days  Discharge Planning: Anticipated Hospital Death.  At this point, I anticipate her prognosis to be hours to days at best.  I would be concerned about plan to transition her from the hospital, and it appears that [facility] does not have a bed available today regardless.  We will plan to reevaluate her tomorrow if she survives the night and bed becomes available.     Primary Diagnoses: Present on Admission: .  Stroke (cerebrum) ([place])   I have reviewed the medical record, interviewed the patient and family, and examined the patient. The following aspects are pertinent.  History reviewed. No pertinent past medical history. Social History   Socioeconomic History  . Marital status: Unknown    Spouse name: None  . Number of children: None  . Years of education: None  . Highest education level: None  Social Needs  . Financial resource strain: None  . Food insecurity - worry: None  . Food insecurity - inability: None  . Transportation needs - medical: None  . Transportation needs - non-medical: None  Occupational History  . None  Tobacco Use  . Smoking status: Unknown If Ever Smoked  . Smokeless tobacco: Never Used  Substance and  Sexual Activity  . Alcohol use: None  . Drug use: None  . Sexual activity: None  Other Topics Concern  . None  Social History Narrative  . None   History reviewed. No pertinent family history. Scheduled Meds: .  stroke: mapping our early stages of recovery book   Does not apply Once  . Chlorhexidine Gluconate Cloth  6 each Topical Daily   Continuous Infusions: . morphine 2 mg/hr (11/15/17 0600)   PRN Meds:.acetaminophen **OR** acetaminophen, albuterol, antiseptic oral rinse, glycopyrrolate **OR** glycopyrrolate **OR** glycopyrrolate, haloperidol **OR** haloperidol **OR** haloperidol lactate, LORazepam **OR** LORazepam **OR** LORazepam, morphine, ondansetron **OR** ondansetron (ZOFRAN) IV, polyvinyl alcohol, sodium chloride flush Medications Prior to Admission:  Prior to Admission medications   Medication Sig Start Date End Date Taking? Authorizing Provider  albuterol (PROVENTIL HFA;VENTOLIN HFA) 108 (90 Base) MCG/ACT inhaler Inhale 2 puffs every 4 (four) hours as needed into the lungs for wheezing or shortness of breath.    Yes [provider]  calcium carbonate (CALCIUM 600) 1500 (600 Ca) MG TABS tablet Take 600 mg of elemental calcium 2 (two) times daily with a meal by mouth.   Yes [provider]  Cyanocobalamin (VITAMIN B 12 PO) Take 2,500 mcg daily by mouth.   Yes [provider]  losartan (COZAAR) 50 MG tablet Take 50 mg daily by mouth.   Yes [provider]  metFORMIN (GLUCOPHAGE-XR) 500 MG 24 hr tablet Take 500 mg daily by mouth.   Yes [provider]  metoprolol succinate (TOPROL-XL) 50 MG 24 hr tablet Take 50 mg daily by mouth. Take with or immediately following a meal.   Yes [provider]  Rivaroxaban (XARELTO) 15 MG TABS tablet Take 15 mg daily by mouth.    Yes [provider]  terbinafine (LAMISIL) 1 % cream Apply 1 application topically 2 (two) times daily. 02/04/17  Yes Stover, Titorya, DPM  terbinafine  (LAMISIL) 250 MG tablet Take 250 mg every other day by mouth.   Yes [provider]  triamcinolone cream (KENALOG) 0.5 % Apply 1 application See admin instructions topically. Filled 07/19/17: apply topically to feet three times daily for 3 weeks, stop for 1 week, then repeat process   Yes [provider]  lidocaine (XYLOCAINE) 5 % ointment Apply 1 application topically 4 (four) times daily as needed. Patient not taking: Reported on 10/25/2017 12/02/16   Landis Martins, DPM   Allergies  Allergen Reactions  . Penicillins Rash   Review of Systems Unable to participate  Physical Exam  General: Unresponsive, periods of apnea, in no acute distress.  HEENT: Neck hyperextended Heart: Tachycardic Lungs: Biminshed Abdomen: Soft, nontender, nondistended, positive bowel sounds.  Ext: Some edema Skin: + peripheral cooling/mottling  Vital  Signs: BP (!) 75/64   Pulse (!) 102   Temp 98.1 F (36.7 C) (Axillary)   Resp 19   Ht 5' 7"  (1.702 m)   Wt 70 kg (154 lb 5.2 oz)   SpO2 93%   BMI 24.17 kg/m  Pain Assessment: CPOT   Pain Score: 0-No pain   SpO2: SpO2: 93 % O2 Device:SpO2: 93 % O2 Flow Rate: .O2 Flow Rate (L/min): 4 L/min  IO: Intake/output summary:   Intake/Output Summary (Last 24 hours) at 2017-11-16 1144 Last data filed at Nov 16, 2017 0600 Gross per 24 hour  Intake 529.2 ml  Output 300 ml  Net 229.2 ml    LBM: Last BM Date: (proir to admission) Baseline Weight: Weight: 69.2 kg (152 lb 8.9 oz) Most recent weight: Weight: 70 kg (154 lb 5.2 oz)     Palliative Assessment/Data:   Flowsheet Rows     Most Recent Value  Intake Tab  Referral Department  Neurology  Unit at Time of Referral  ICU  Palliative Care Primary Diagnosis  Neurology  Date Notified  10/27/17  Palliative Care Type  New Palliative care  Reason for referral  Clarify Goals of Care, End of Life Care Assistance  Date of Admission  11/17/2017  Date first seen by Palliative Care  10/27/17  #  of days Palliative referral response time  0 Day(s)  # of days IP prior to Palliative referral  3  Clinical Assessment  Palliative Performance Scale Score  10%  Pain Max last 24 hours  Not able to report  Pain Min Last 24 hours  Not able to report  Psychosocial & Spiritual Assessment  Palliative Care Outcomes      Time Total: 40 minutes Greater than 50%  of this time was spent counseling and coordinating care related to the above assessment and plan.  Signed by: Micheline Rough, MD   Please contact Palliative Medicine Team phone at [phone] for questions and concerns.  For individual provider: See Shea Evans

## 2017-11-20 NOTE — Discharge Summary (Signed)
Stroke Discharge Summary  Patient ID: Carol Parker   MRN: [id number]      DOB: 11-11-1929  Date of Admission: 11/23/17 Date of Discharge: 10/22/2017  Attending Physician:  Marvel Plan, MD, Stroke MD Consultant(s):   Treatment Team:  Yolonda Kida, MD pulmonary/intensive care, Pallative, Ortho Patient's PCP:  Gabriel Cirri, NP  DISCHARGE DIAGNOSIS:  Principal Problem:   Stroke (cerebrum) (HCC)    ICH (intracerebral hemorrhage) (HCC)   SAH (subarachnoid hemorrhage) (HCC)   IVH (intraventricular hemorrhage) (HCC)   Acute blood loss anemia   Scalp laceration   left hip fracture   Cerebral edema (HCC)   Hydrocephalus Active Problems:   Cognitive impairment   History reviewed. No pertinent past medical history. Past Surgical History:  Procedure Laterality Date  . IR PERCUTANEOUS ART THROMBECTOMY/INFUSION INTRACRANIAL INC DIAG ANGIO  11-23-2017  . IR US GUIDE VASC ACCESS RIGHT  11/23/2017    Allergies as of 10/26/2017      Reactions   Penicillins Rash      Medication List    STOP taking these medications   albuterol 108 (90 Base) MCG/ACT inhaler Commonly known as:  PROVENTIL HFA;VENTOLIN HFA   CALCIUM 600 1500 (600 Ca) MG Tabs tablet Generic drug:  calcium carbonate   lidocaine 5 % ointment Commonly known as:  XYLOCAINE   losartan 50 MG tablet Commonly known as:  COZAAR   metFORMIN 500 MG 24 hr tablet Commonly known as:  GLUCOPHAGE-XR   metoprolol succinate 50 MG 24 hr tablet Commonly known as:  TOPROL-XL   Rivaroxaban 15 MG Tabs tablet Commonly known as:  XARELTO   terbinafine 1 % cream Commonly known as:  LAMISIL   terbinafine 250 MG tablet Commonly known as:  LAMISIL   triamcinolone cream 0.5 % Commonly known as:  KENALOG   VITAMIN B 12 PO       LABORATORY STUDIES CBC    Component Value Date/Time   WBC 4.3 10/27/2017 0145   RBC 2.52 (L) 10/27/2017 0145   HGB 7.6 (L) 10/27/2017 0145   HCT 22.7 (L) 10/27/2017 0145   PLT 135  (L) 10/27/2017 0145   MCV 90.1 10/27/2017 0145   MCH 30.2 10/27/2017 0145   MCHC 33.5 10/27/2017 0145   RDW 15.5 10/27/2017 0145   LYMPHSABS 0.6 (L) 10/27/2017 0145   MONOABS 0.5 10/27/2017 0145   EOSABS 0.1 10/27/2017 0145   BASOSABS 0.0 10/27/2017 0145   CMP    Component Value Date/Time   NA 156 (H) 10/27/2017 1250   K 3.0 (L) 10/27/2017 0145   CL >130 (HH) 10/27/2017 0145   CO2 20 (L) 10/27/2017 0145   GLUCOSE 153 (H) 10/27/2017 0145   BUN 11 10/27/2017 0145   CREATININE 0.64 10/27/2017 0145   CALCIUM 8.4 (L) 10/27/2017 0145   GFRNONAA >60 10/27/2017 0145   GFRAA >60 10/27/2017 0145   COAGS Lab Results  Component Value Date   INR 1.25 10/26/2017   INR 1.38 2017/11/23   Lipid Panel    Component Value Date/Time   CHOL 108 10/25/2017 0510   TRIG 79 10/25/2017 0510   HDL 41 10/25/2017 0510   CHOLHDL 2.6 10/25/2017 0510   VLDL 16 10/25/2017 0510   LDLCALC 51 10/25/2017 0510   HgbA1C  Lab Results  Component Value Date   HGBA1C 5.8 (H) 10/25/2017   Urinalysis No results found for: COLORURINE, APPEARANCEUR, LABSPEC, PHURINE, GLUCOSEU, HGBUR, BILIRUBINUR, KETONESUR, PROTEINUR, UROBILINOGEN, NITRITE, LEUKOCYTESUR Urine Drug Screen No results found  for: LABOPIA, COCAINSCRNUR, LABBENZ, AMPHETMU, THCU, LABBARB  Alcohol Level No results found for: [facility]   SIGNIFICANT DIAGNOSTIC STUDIES I have personally reviewed the radiological images below and agree with the radiology interpretations.  10/25/2017 IMPRESSION: 1. Multifocal intraparenchymal hemorrhage, some of which appear smaller most compatible with a component of resolving contrast staining. 2. Increasing intraventricular blood products with mild LEFT ventricle entrapment. 5 mm RIGHT to LEFT midline shift, decreased from 7 mm. 3. Small volume scattered subarachnoid hemorrhage. 4. Moderate LEFT parietoccipital scalp hematoma.   11/11/2017 IMPRESSION: 1. Multiple sites of parenchymal hemorrhage involving the right  frontal lobe, right basal ganglia, and right temporal lobe. 2. Progressive hemorrhage in the right occipital lobe. 3. Extensive effacement of the sulci over the right hemispheric and 6 mm of right-to-left midline shift. 4. Diffuse subarachnoid hemorrhage is now present. 5. Intraventricular hemorrhages noted with early dilation of the left temporal tip.  11/10/2017 IMPRESSION: 1. Small new multifocal areas of abnormal hyperdensity in the right hemisphere. Although some of these in the right MCA territory might reflect post ischemic contrast staining, there is at least a small volume of acute hemorrhage, including that seen at the right occipital pole which is probably in the subarachnoid space. This was discussed in person with Dr. Milon DikesASHISH ARORA on 11/10/2017 at 1200 hours. 2. No intracranial mass effect. No new areas of cytotoxic edema in the right MCA territory. 3. New or increased left posterior scalp hematoma since 0902 hours today, 7 mm in thickness. No underlying skull fracture.   Cerebral angio 11/08/2017 IMPRESSION: Status post cerebral angiogram and successful mechanical thrombectomy of occluded right superior division M2 branch, with restoration of TICI 3 flow. Closure of right common femoral artery access with overlapping suture mediated devices.   MRI and MRA head  MRI head: 1. Stable large acute hemorrhage within right anterior temporal lobe and right frontal lobe. 2. Acute hemorrhage within right caudothalamic groove and right putamen are stable mildly increased in size given differences in technique. 3. Subarachnoid hemorrhage predominantly within sylvian fissures and right MCA cistern. 4. Intraventricular hemorrhage within occipital horns and right temporal horn of lateral ventricle. Stable ventricle size. 5. Acute infarct of right basal ganglia involving putamen and caudate body. Additional scattered acute cortical infarcts in right MCA distribution. 6. Stable mass effect with  partial effacement of right lateral ventricle and 5 mm right-to-left midline shift. MRA head: 1. Several M2 branches extending to the right anterolateral frontal region appear occluded, the area is partially obscured by artifact from motion and the presence of hemorrhage within the MCA cistern. 2. Right cavernous ICA medially directed aneurysm measuring 2 mm to dome with 4 mm base. 3. Otherwise no large vessel occlusion, aneurysm, or significant stenosis is identified.  2D Echocardiogram  - LVEF 60-65%, normal wall thickness, normal wall motion, mild AI, mild to moderate MR, moderate biatrial enlargement, moderate TR, RVSP 62 mmHg, dilated IVC.    HISTORY OF PRESENT ILLNESS Raygen Regino SchultzeM Masella is an 83 y.o. female with a past medical history of hypertension asthma type 2 diabetes atrial fibrillation on Xarelto and history of recent recent urinary tract infection [facility] for a left femoral head fracture.  Per records patient fell at home hit the left side of her head and her left hip on the way down.  She has been off her Xarelto since admission on 11/2 and was scheduled for surgical repair of her hip fracture today when she was found to be obtunded with  left facial weakness and left-sided weakness at 856 this morning.  With a documented NIH of 12.  0935 CT head and neck which was positive for a right ICA terminus and right MCA M1 segment occlusion and an early right MCA territory ischemia with ASPECTS 9.  No significant arterial stenosis in the Head/Neck.  Results discussed between Dr. Lurlean Leydeneardon and Dr. Jerrell BelfastAurora at 9:44 AM.  [facility].  At 10:04 AM TPA bolus was started at 1009 TPA infusion was started at 51.4 and at 10:37 AM TPA infusion was completed per report from the [facility] transport team.  TPA infusion times per report between Dr. Jerrell BelfastAurora and [facility] Jossie NgKatherine  Riordan are that tPA infusion was completed at 10:53. [facility].   [facility] NIH remained 12. She was immediately taken to CT. CT head shows possible subarachnoid hemorrhage versus contrast staining in the right frontal lobe and basal ganglia.  Imaging was reviewed by Dr. Shella SpearingWaggener and Dr. Jerrell BelfastAurora decision was made to proceed with IR intervention.   Date last known well: Date: 11/13/2017 Time last known well: Time: 07:40 tPA Given: Yes  Modified Rankin: Rankin Score=0   HOSPITAL COURSE Stroke:  right BG infarct with right M1 occlusion s/p tPA resulting small right frontal and occipital SAH, underwent IR mechanical thrombectomy for right MCA TICI3 revascularization, but causing right frontal, temporal large hematomas with SAH and IVH  Resultant lethargic, left facial droop, left hemiplegia  CTA head and neck - right M1 occlusion with relatively good collateral circulation on the right hemisphere  CT head s/p tPA - hyperdensity right BG could be due to contrast standing, however, small hyperdensity right frontal and occipital likely due to small SAH  DSA - right M1/M2 revascularization with TICI 3 reperfusion  CT head repeat s/p IR x 2 - right large frontal, temporal and occipital hematomas with SAH and small amount IVH  MRI right BG infarct with right BG, right frontal and right temporal and small right occipital hematoma as well as right sylvian fissure SAH and b/l lateral ventricle IVH  MRA right MCA distal branches missing  2D Echo EF 60-65%  LDL 79  HgbA1c 5.8  SCDs for VTE prophylaxis  Diet NPO time specified   Xarelto (rivaroxaban) daily off for 2 days prior to admission, now on No antithrombotic due to ICH  Therapy recommendations:  None  Disposition: Comfort Care  Respiratory failure  CXR showed persistent left lower lobe pneumonia  Intubated for airway protection 10/26/17  Agonal breathing  10/27/17  Palliative care involved  Decision made for comfort care  Terminally extubated 10/27/17  Cerebral edema  Midline shift stable on repeat CT and MRI  3% saline on hold  Na 132->136->141->143->146->151->156  Decision made for comfort care  Hydrocephalus   Slight hydrocephalus on the left lateral ventricles  MRI no significant hydrocephalus  No surgical intervention  Left scalp laceration, improved  S/p fall  purfusely bleeding after tPA  Compress dressing  Stable, no more bleeding  Anemia requiring transfusion  Hb 6.5-> PRBC -> 9.0->8.6->7.7->7.4->7.6  Comfort care  Left femoral head fracuture  Keep bed rest for now  Ortho on board  Initially planned for Sx to relieve pain  No surgery after comfort care decision made  Ethics   no next of kin or blood relatives that are alive.   3 friends from church listed  above, who areher social support system.   No medical power of attorney listed.  Psych consult deemed pt no capacity for her own decisions  ethic committee assigned friends as decision maker   Diabetes  HgbA1c 5.8 goal < 7.0  Controlled  Hypotension  Developed hypotension at the same time of respiratory condition worsening  Comfort care  Hyperlipidemia  Home meds:  none   LDL 79, goal < 70  Other Stroke Risk Factors  Advanced age  Other Active Problems  Hyponatremia  Thrombocytopenia   DISCHARGE EXAM Pt deceased.   Marvel Plan, MD PhD Stroke Neurology 11-26-17 6:15 PM

## 2017-11-20 NOTE — Progress Notes (Signed)
Nutrition Brief Note  Chart reviewed. Pt now transitioning to comfort care.  No nutrition interventions warranted at this time.  Please re-consult as needed.   Myliyah Rebuck RD, LDN, CNSC [phone] Pager [phone] After Hours Pager    

## 2017-11-20 NOTE — Progress Notes (Signed)
[facility]:  RN  Received request from Benard RinkJessie, LCSW, for family interest [facility].  Chart reviewed and could not reach the family to acknowledge the referral.  Unfortunately, [facility] is not able to offer a room today.  LCSW is aware [facility] liaison will follow up with LCSW and family tomorrow or sooner if room becomes available.  Please do not hesitate to call with questions.  Thank you for this referral.  Adele BarthelAmy Evans, RN, BSN [facility] [phone]  All hospital liaisons are now on AMION.

## 2017-11-20 DEATH — deceased

## 2018-04-19 IMAGING — DX DG CHEST 1V PORT
1 series · 1 of 1 positions shown · non-contrast
Comparison: Chest radiograph 10/25/2017

CLINICAL DATA: Hypoxia

EXAM:
PORTABLE CHEST 1 VIEW

[chest ap]
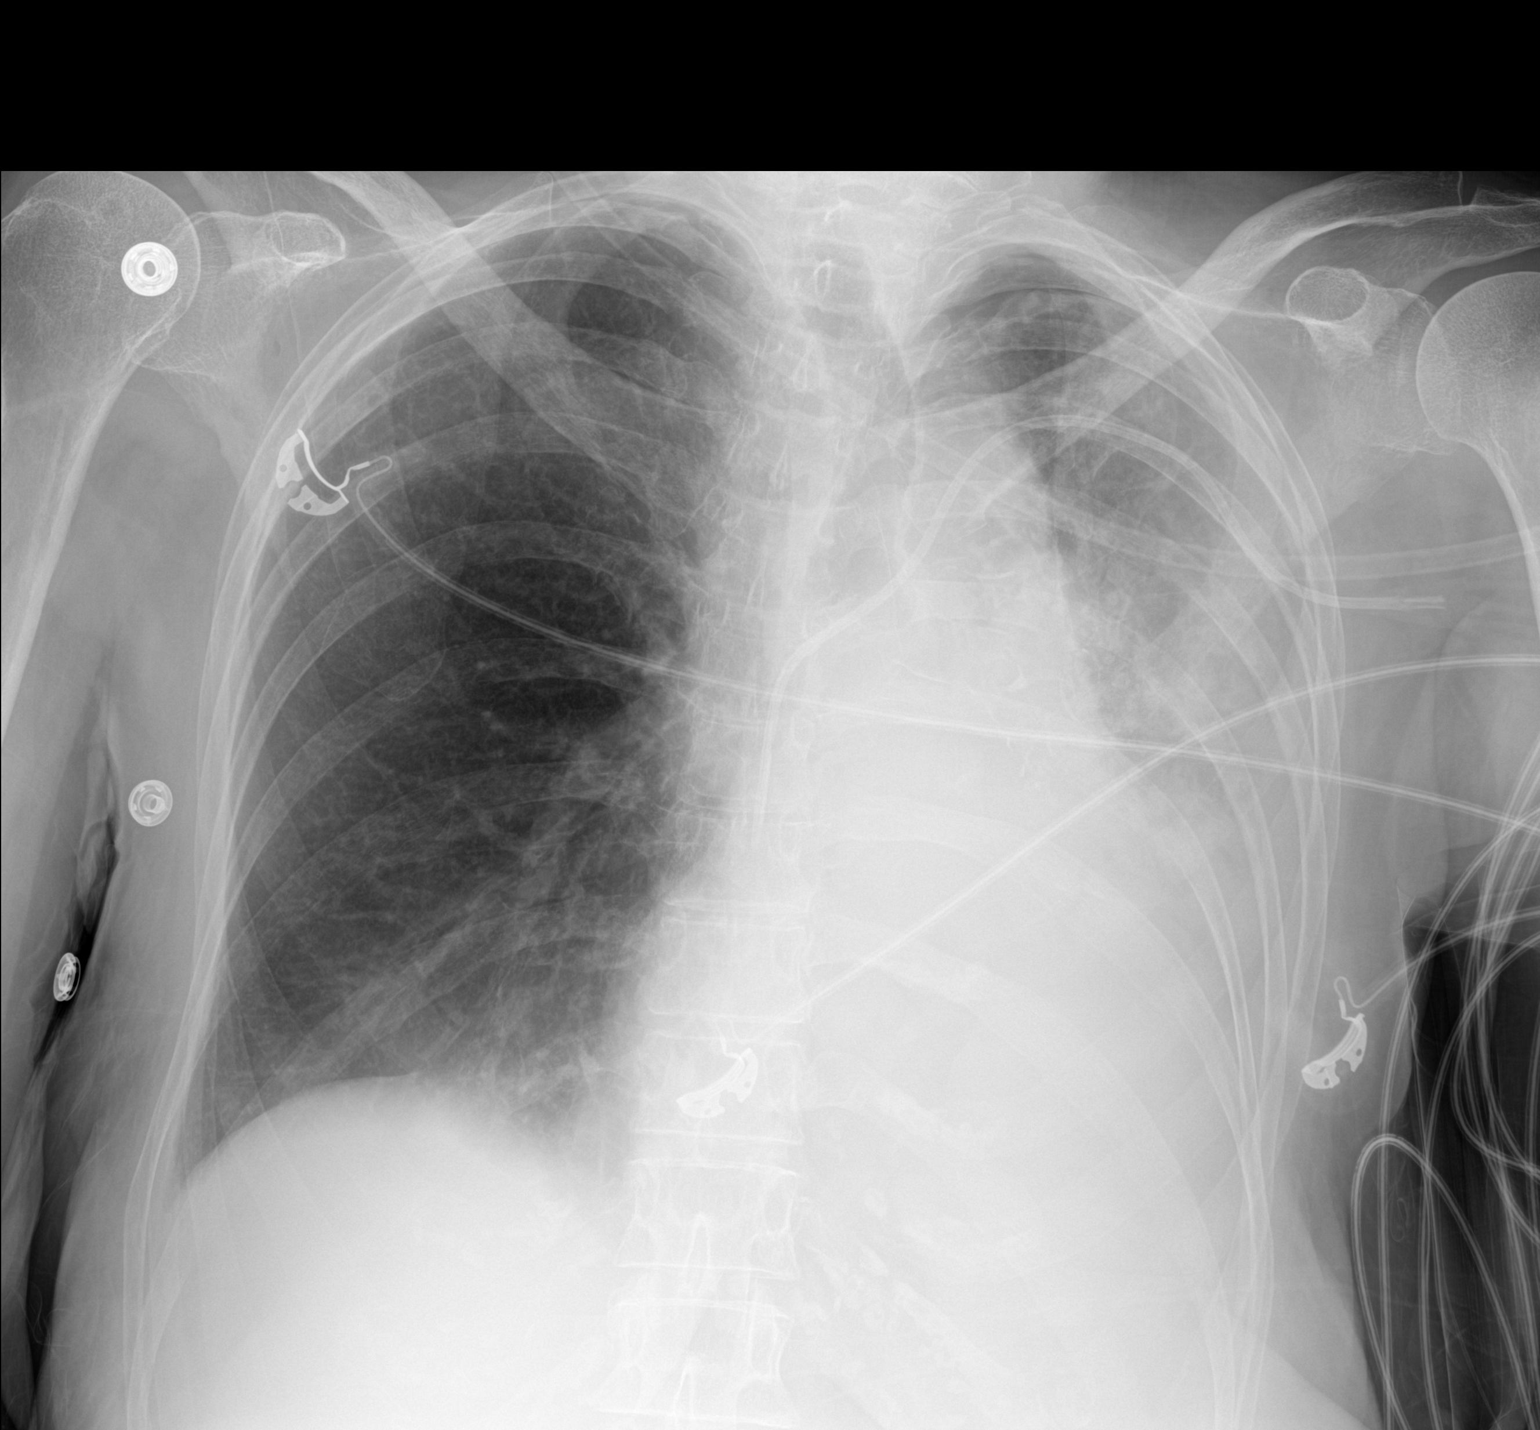

[1 of 1 positions shown; findings below may reference images not displayed]

FINDINGS: There is complete collapse of the left lower lobe with leftward
mediastinal shift. Suspected left pleural effusion. Decreased
aeration of the left upper lobe. Left subclavian central venous
catheter tip is at the cavoatrial junction. The right lung is clear.
IMPRESSION: Complete collapse of the left lower lobe with associated left
hemithoracic volume loss. This may be secondary to mucous plugging.

## 2018-04-20 IMAGING — DX DG CHEST 1V PORT
1 series · 1 of 1 positions shown · non-contrast
Comparison: October 26, 2017

CLINICAL DATA: Respiratory failure with hypoxia

EXAM:
PORTABLE CHEST 1 VIEW

[chest]
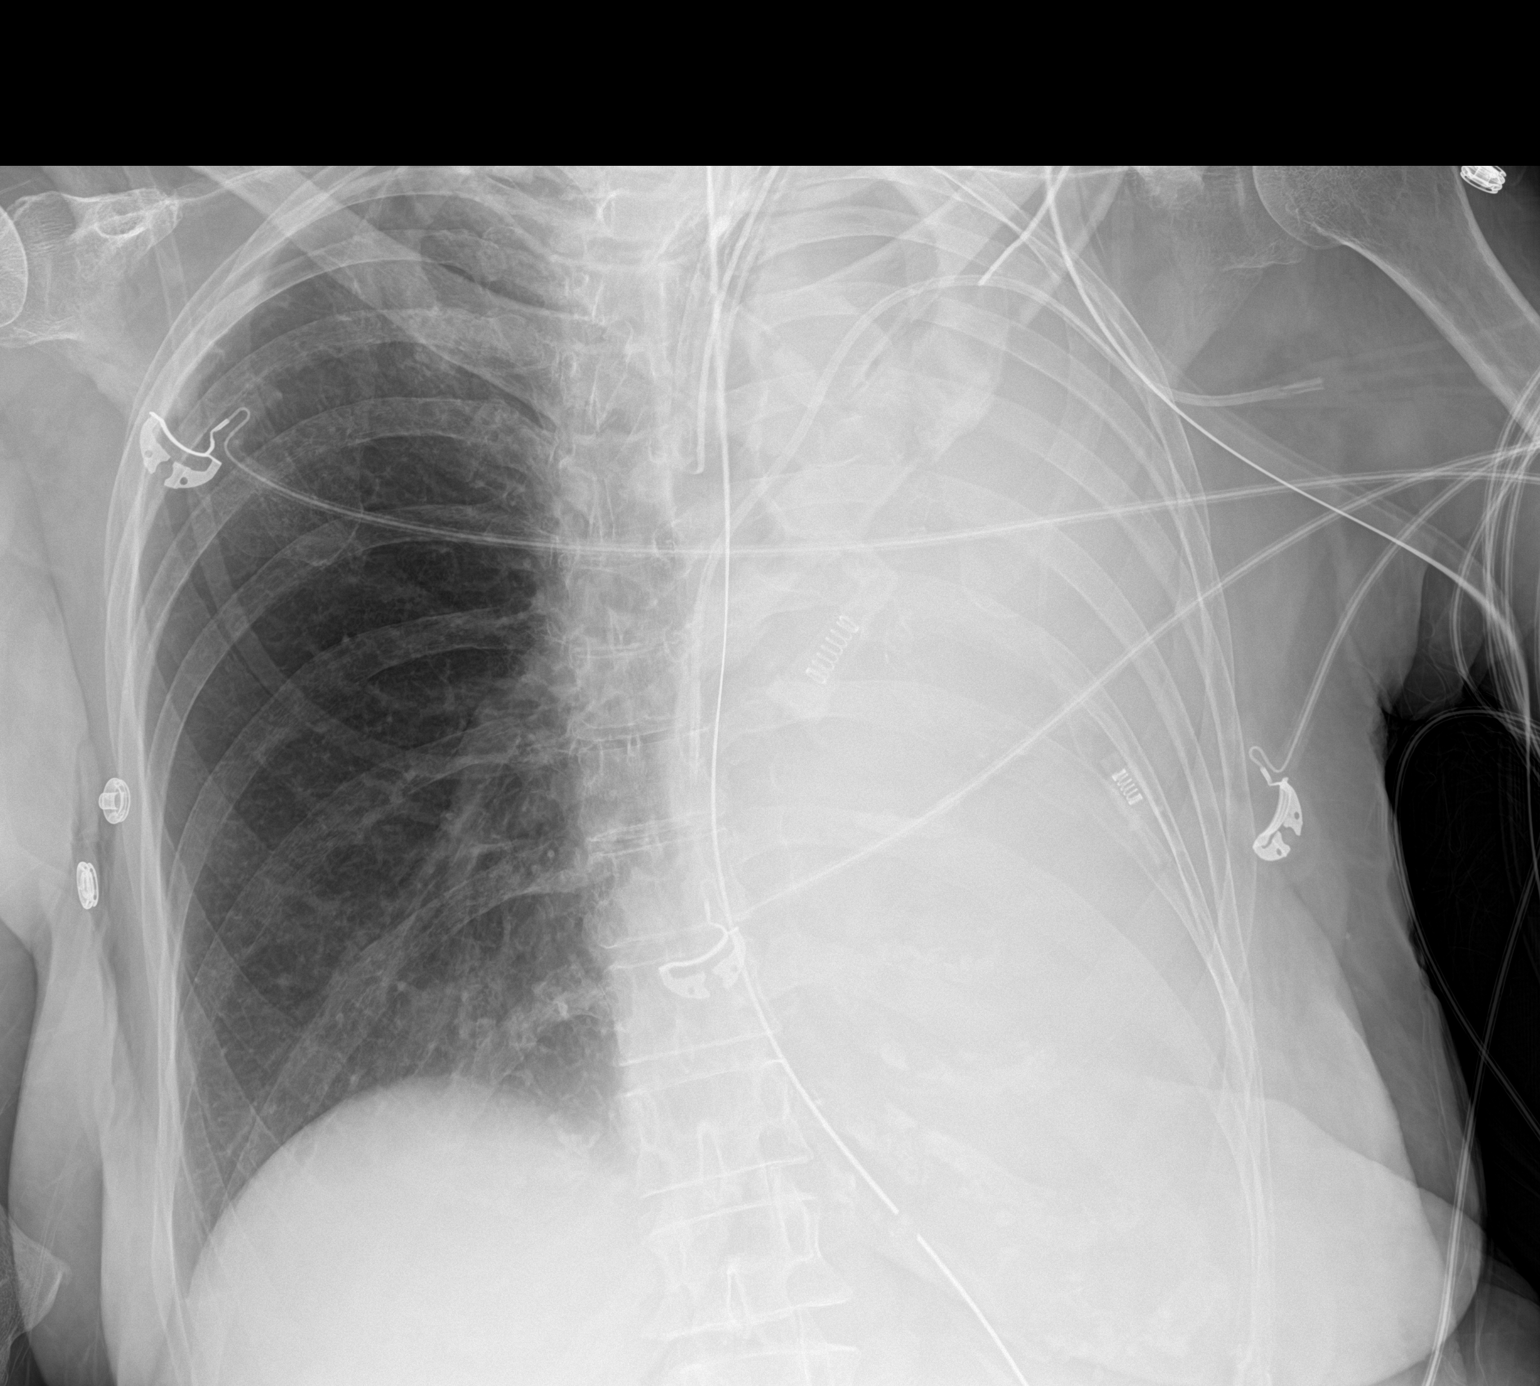

[1 of 1 positions shown; findings below may reference images not displayed]

FINDINGS: Endotracheal tube tip is now 1.8 cm above the carina. Central
catheter tip is in the superior vena cava. Nasogastric tube tip is
below the diaphragm with the side port in the proximal stomach
region. No pneumothorax.

There is persistent consolidation throughout the left lung with
volume loss. There may be ease a degree of effusions superimposed on
the left. There is shift of heart and mediastinum toward the left.
Right lung is hyperexpanded but clear. The heart size is within
normal limits. Pulmonary vascularity on the right appears
unremarkable. Pulmonary vascular on the left is obscured. No bone
lesions. No adenopathy in regions that can be interrogated.
IMPRESSION: Tube and catheter positions as described without pneumothorax.
Consolidation with volume loss on the left with shift of heart and
mediastinum toward the left. This finding raises concern for
potential mucous plugging on the left causing this degree of volume
loss.

Right lung is hyperexpanded clear. Cardiac silhouette grossly within
normal limits and stable.

These results will be called to the ordering clinician or
representative by the Radiologist Assistant, and communication
documented in the PACS or zVision Dashboard.
